# Patient Record
Sex: Female | Born: 1980
Health system: Southern US, Community
[De-identification: ages and names within clinical notes are randomized; demographics above are authoritative.]

## PROBLEM LIST (undated history)

## (undated) DIAGNOSIS — N200 Calculus of kidney: Secondary | ICD-10-CM

## (undated) DIAGNOSIS — I10 Essential (primary) hypertension: Secondary | ICD-10-CM

## (undated) DIAGNOSIS — R51 Headache: Secondary | ICD-10-CM

## (undated) DIAGNOSIS — D219 Benign neoplasm of connective and other soft tissue, unspecified: Secondary | ICD-10-CM

## (undated) DIAGNOSIS — R519 Headache, unspecified: Secondary | ICD-10-CM

## (undated) HISTORY — DX: Headache: R51

## (undated) HISTORY — DX: Calculus of kidney: N20.0

## (undated) HISTORY — DX: Essential (primary) hypertension: I10

## (undated) HISTORY — DX: Benign neoplasm of connective and other soft tissue, unspecified: D21.9

## (undated) HISTORY — DX: Headache, unspecified: R51.9

---

## 2000-10-01 ENCOUNTER — Other Ambulatory Visit: Admission: RE | Admit: 2000-10-01 | Discharge: 2000-10-01 | Payer: Self-pay | Admitting: *Deleted

## 2003-11-09 ENCOUNTER — Other Ambulatory Visit: Admission: RE | Admit: 2003-11-09 | Discharge: 2003-11-09 | Payer: Self-pay | Admitting: Family Medicine

## 2004-12-21 ENCOUNTER — Other Ambulatory Visit: Admission: RE | Admit: 2004-12-21 | Discharge: 2004-12-21 | Payer: Self-pay | Admitting: Gynecology

## 2005-11-20 ENCOUNTER — Other Ambulatory Visit: Admission: RE | Admit: 2005-11-20 | Discharge: 2005-11-20 | Payer: Self-pay | Admitting: Gynecology

## 2007-01-19 ENCOUNTER — Other Ambulatory Visit: Payer: Self-pay | Admitting: Obstetrics and Gynecology

## 2007-08-06 ENCOUNTER — Inpatient Hospital Stay (HOSPITAL_COMMUNITY): Admission: AD | Admit: 2007-08-06 | Discharge: 2007-08-11 | Payer: Self-pay | Admitting: Obstetrics and Gynecology

## 2007-12-18 ENCOUNTER — Inpatient Hospital Stay (HOSPITAL_COMMUNITY): Admission: AD | Admit: 2007-12-18 | Discharge: 2007-12-20 | Payer: Self-pay | Admitting: Obstetrics and Gynecology

## 2007-12-18 ENCOUNTER — Inpatient Hospital Stay (HOSPITAL_COMMUNITY): Admission: AD | Admit: 2007-12-18 | Discharge: 2007-12-18 | Payer: Self-pay | Admitting: Obstetrics and Gynecology

## 2009-10-22 ENCOUNTER — Inpatient Hospital Stay (HOSPITAL_COMMUNITY): Admission: AD | Admit: 2009-10-22 | Discharge: 2009-10-24 | Payer: Self-pay | Admitting: Obstetrics and Gynecology

## 2010-09-18 LAB — RH IMMUNE GLOB WKUP(>/=20WKS)(NOT WOMEN'S HOSP)

## 2010-09-18 LAB — CBC
Hemoglobin: 12.8 g/dL (ref 12.0–15.0)
MCHC: 34.5 g/dL (ref 30.0–36.0)
MCV: 97.4 fL (ref 78.0–100.0)
RBC: 3.55 MIL/uL — ABNORMAL LOW (ref 3.87–5.11)
RBC: 3.83 MIL/uL — ABNORMAL LOW (ref 3.87–5.11)
WBC: 14.1 10*3/uL — ABNORMAL HIGH (ref 4.0–10.5)

## 2010-11-13 NOTE — Op Note (Signed)
Marie Barnett, DETJEN               ACCOUNT NO.:  [id number]   MEDICAL RECORD NO.:  [id number]          PATIENT TYPE:  INP   LOCATION:                                FACILITY:  [facility]   PHYSICIAN:  Bertram Millard. Dahlstedt, M.D.DATE OF BIRTH:  1980/12/20   DATE OF PROCEDURE:  08/10/2007  DATE OF DISCHARGE:  08/11/2007                               OPERATIVE REPORT   PREOPERATIVE DIAGNOSIS:  Left proximal ureteral stone, obstructing, 20  week intrauterine pregnancy.   POSTOPERATIVE DIAGNOSIS:  Left proximal ureteral stone, obstructing, 20  week intrauterine pregnancy.   PROCEDURE PERFORMED:  Cystoscopy, left retrograde ureteral pyelogram,  left ureteroscopy (flexible) with Holmium laser of left proximal  ureteral stone, double-J stent placement.   SURGEON:  Bertram Millard. Dahlstedt, M.D.   ANESTHESIA:  General endotracheal.   COMPLICATIONS:  None.   BRIEF HISTORY:  30 year old G3, P0, at 20 weeks intrauterine pregnancy.  She presented approximately four days ago with the acute onset of left  flank pain.  She was admitted to [facility] for pain management.  The first ultrasound revealed minimal hydronephrosis on the left and she  was left on a PCA pump over the weekend.  She had a repeat ultrasound  today with significant left hydronephrosis with a hyperechoic area seen  in the proximal left ureter consistent with a stone.  As she has had  persistent pain, has needed around the clock pain medicine, and she has  a fairly large proximal ureteral stone, it was recommended that she  undergo intervention.  Options included stenting the patient, performing  ureteroscopy and lasering the stone/extraction versus watchful waiting.  I have recommended the laser ablation with possible stone extraction  versus letting the stones pass on their own.  The possibility of stent  placement was also discussed.  The risks and operative complications  were discussed, specifically, preterm labor,  infection, bleeding, loss  of her fetus.  She understands these and desires to proceed.  She was  seen by a neonatal specialist preoperatively for clearance.  She had  fetal heart tones monitor preoperatively.   DESCRIPTION OF PROCEDURE:  The patient was identified in the holding  area.  The surgical side was marked, she was administered preoperative  IV antibiotics.  She was taken to the operating room where general  anesthetic was administered using the endotracheal tube.  She was placed  in the dorsal lithotomy position.  The genitalia and perineum were  prepped and draped.  A 22 French panendoscope was advanced in her  bladder.  It appeared to be normal.   The left ureter was cannulated with an open ended catheter.  Her entire  ureter was normal except for a filling defect in the upper ureter right  at the UPJ.  There was mild hydronephrosis proximal to this.  I then  placed a guidewire by the stone, removed the ureteral catheter and the  cystoscope.  I then dilated the ureter with the inner core of an access  sheath, then placed the ureteral access sheath (35 cm) up to the mid  ureter.  Through  this, I then passed a flexible ureteroscope.  The  ureteroscope was passed under direct vision all the way to the UPJ on  the left.  The stone was encountered.  It was light tan in color.  The  200 micron laser fiber was then passed through this, and at a power of 8  joules, the stone was fragmented into multiple small pieces.  Quite a  few these rinsed distally while I was fragmenting the stone.  There were  probably 5-10 larger fragments that I thought would pass easily without  extraction.  As most of the small fragments rinsed distally, some of the  larger fragments passed more proximally into the renal pelvis.  I did  not feel that these need to be fragmented more, that they would pass  with the aid of a stent.  At this point, the ureteroscope and the access  sheath were removed.  I  then placed, over the same guidewire using  cystoscopic guidance, a 24 cm x 6 French contour stent.  Following  placement of the stent, good curls were seen proximally and distally  using cystoscopic and fluoroscopic guidance.  The scope was withdrawn  after the bladder was drained.   The patient tolerated the procedure well.  She was transported to the  PACU in stable condition.  Fetal heart tones will be checked while in  the PACU and she will be transported back to [facility] via  [facility] following reaction.      Bertram Millard. Dahlstedt, M.D.  Electronically Signed     SMD/MEDQ  D:  08/10/2007  T:  08/11/2007  Job:  [id number]   cc:   Marcelino Duster L. Vincente Poli, M.D.  Fax: [phone]

## 2010-11-13 NOTE — Discharge Summary (Signed)
NAMEADIVA, BOETTNER               ACCOUNT NO.:  [id number]   MEDICAL RECORD NO.:  [id number]          PATIENT TYPE:  INP   LOCATION:  9310                          FACILITY:  [facility]   PHYSICIAN:  Guy Sandifer. Henderson Cloud, M.D. DATE OF BIRTH:  [birth year]   DATE OF ADMISSION:  08/06/2007  DATE OF DISCHARGE:  08/11/2007                               DISCHARGE SUMMARY   ADMITTING DIAGNOSES:  1. Intrauterine pregnancy at 21 weeks estimated gestational age.  2. Left flank pain.   DISCHARGE DIAGNOSES:  1. Intrauterine pregnancy at 19-1/2 weeks estimated additional age.  2. Left kidney stone.   PROCEDURE:  On August 10, 2007 laser ablation of left kidney stone per  Dr. Marcine Matar.   REASON FOR ADMISSION:  This patient is a 30 year old married white  female G3, P0 at 70 weeks who presented with an acute onset of left  flank pain on August 06, 2007.  She presented to the hospital via EMS.  At that time evaluation revealed stable vital signs with a white count  of 13.4, hemoglobin of 12.9, platelets of 282,000.  Urine chemistry had  large hemoglobin.  Ultrasound was negative for kidney stones, although  the ureteral jet was diminished on the left side.  She was admitted for  expectant management with fluids and pain medication.  The pain  persisted through the weekend, although at times it was under some  better control.  Repeat ultrasound on August 10, 2007 revealed an  approximately 1 cm kidney stone in the left renal pelvis that was  partially obstructing the ureter.  Consultation with Dr. Retta Diones was  then undertaken who recommended surgery with ureteroscopy and laser  ablation of the kidney stone.  Consultation with Dr. Margot Ables of maternal  fetal medicine was undertaken prior to transfer.  The patient was  transferred to [facility] where she underwent the above  procedure without complication.  She was sent back to [facility].  She has a stent in the left ureter.   Today she is improved and feeling  better, although she has some discomfort and some hematuria.  Dr.  Retta Diones recommends discharge from his standpoint.  He will follow up  in a week to remove the stent.  On examination today the patient is  afebrile with stable vital signs.  The uterus was soft and nontender.   CONDITION ON DISCHARGE:  Good.   DIET:  Regular as tolerated.   ACTIVITY:  Rest, no lifting, no operation of automobiles, no vaginal  entry.  She is to call the office for problems including but not limited  to pain, persistent nausea or vomiting or temperature of 101 degrees.   MEDICATIONS:  Vicodin p.r.n. per Dr. Retta Diones, Keflex as directed from  Dr. Retta Diones.   PLAN:  Follow up in our office in 1-2 weeks.  Follow up with Dr.  Retta Diones in 1 week.      Guy Sandifer Henderson Cloud, M.D.  Electronically Signed     JET/MEDQ  D:  08/11/2007  T:  08/12/2007  Job:  [id number]   cc:   Bertram Millard. Dahlstedt,  M.D.  Fax: [phone]   Felipa Eth, MD

## 2010-11-13 NOTE — Consult Note (Signed)
Marie Barnett, LITES               ACCOUNT NO.:  [id number]   MEDICAL RECORD NO.:  [id number]          PATIENT TYPE:  INP   LOCATION:  9310                          [facility]ACILITY:  [facility]   PHYSICIAN:  Bertram Millard. Dahlstedt, M.D.DATE OF BIRTH:  1981-04-14   DATE OF CONSULTATION:  08/10/2007  DATE OF DISCHARGE:                                 CONSULTATION   REASON FOR CONSULTATION:  Right upper ureteral stone.   BRIEF HISTORY:  This 30 year old G3, P0, female at 20 weeks pregnancy,  presented 3 days ago with acute onset of flank pain.  This was on the  left side.  Upon initial presentation, she came to the maternity  admissions by EMS.  She has had two ultrasounds.  The first showed  slight prominence of her left collecting system.  The repeat renal  ultrasound today revealed significant left hydronephrosis and a  hyperechoic area in the proximal left ureter.  She has now carries the  diagnosis of an obstructing left UPJ stone.  Urologic consultation is  requested.   There is no prior history of kidney stones with this patient.  She has  had no issues during this pregnancy.  There is a family history of  kidney stones in her father.  She denies any gross hematuria.  She  denies any fever or chills.  There was nausea upon her initial pain.  She has been in the hospital here, with this being the fourth day of her  visit.  She has been on a Dilaudid PCA for intermittent pain.   MEDICAL HISTORY:  Basically insignificant.   MEDICATIONS:  Only a prenatal vitamin.   ALLERGIES:  AMOXICILLIN.   She is married, is a Astronomer.  She does  not use tobacco.   EXAM:  A pleasant female.  She appeared comfortable.  Abdomen was  gravid, nontender.   I reviewed the patient's renal ultrasound, and this showed a proximal  ureteral stone.   IMPRESSION:  Proximal left ureteral stone with hydronephrosis.   PLAN:  1. I spoke with the patient and her husband at length.   Seeing that      this is about a 10-mm stone located proximally, and a      hospitalization of 4 days, I do not think she is going to pass the      stone.  2. I have recommended that we proceed with anesthetic, cystoscopy,      retrograde pyelogram, ureteroscopy and      laser ablation of the stone with a temporary stent placement.      Risks and complications were discussed, including but not limited      to infection, bleeding, injury to the urinary tract and preterm      labor due to the anesthetic.  They understand these and desire to      proceed.      Bertram Millard. Dahlstedt, M.D.  Electronically Signed     SMD/MEDQ  D:  08/10/2007  T:  08/11/2007  Job:  [id number]

## 2011-03-22 LAB — URINALYSIS, ROUTINE W REFLEX MICROSCOPIC
Bilirubin Urine: NEGATIVE
Glucose, UA: NEGATIVE
Ketones, ur: NEGATIVE
Ketones, ur: NEGATIVE
Ketones, ur: NEGATIVE
Leukocytes, UA: NEGATIVE
Nitrite: NEGATIVE
Nitrite: NEGATIVE
Nitrite: NEGATIVE
Protein, ur: NEGATIVE
Protein, ur: NEGATIVE
Specific Gravity, Urine: 1.01
Urobilinogen, UA: 0.2
Urobilinogen, UA: 0.2
pH: 7.5
pH: 8.5 — ABNORMAL HIGH

## 2011-03-22 LAB — CBC
HCT: 36.4
MCV: 94.5
RBC: 3.86 — ABNORMAL LOW
WBC: 13.4 — ABNORMAL HIGH

## 2011-03-22 LAB — COMPREHENSIVE METABOLIC PANEL
AST: 19
Alkaline Phosphatase: 62
BUN: 6
CO2: 23
Chloride: 102
Creatinine, Ser: 0.53
GFR calc Af Amer: >60
GFR calc non Af Amer: >60
Total Bilirubin: 0.6

## 2011-03-22 LAB — URINE CULTURE
Colony Count: NO GROWTH
Culture: NO GROWTH

## 2011-03-22 LAB — URINE MICROSCOPIC-ADD ON

## 2011-03-22 LAB — DIFFERENTIAL
Basophils Absolute: 0
Basophils Relative: 0
Eosinophils Relative: 0
Lymphocytes Relative: 9 — ABNORMAL LOW

## 2011-03-28 LAB — CBC
HCT: 33.9 — ABNORMAL LOW
HCT: 39.8
Hemoglobin: 13.6
MCHC: 33.6
MCHC: 34.1
MCV: 99.3
RBC: 3.42 — ABNORMAL LOW
RBC: 4.11
RDW: 13.7
WBC: 18 — ABNORMAL HIGH

## 2011-04-15 LAB — ABO/RH: ABO/RH(D): O NEG

## 2015-03-16 ENCOUNTER — Encounter: Payer: Self-pay | Admitting: Primary Care

## 2015-03-16 ENCOUNTER — Ambulatory Visit (INDEPENDENT_AMBULATORY_CARE_PROVIDER_SITE_OTHER): Payer: BLUE CROSS/BLUE SHIELD | Admitting: Primary Care

## 2015-03-16 ENCOUNTER — Encounter (INDEPENDENT_AMBULATORY_CARE_PROVIDER_SITE_OTHER): Payer: Self-pay

## 2015-03-16 VITALS — BP 122/82 | HR 73 | Temp 97.5°F | Ht 64.25 in | Wt 166.8 lb

## 2015-03-16 DIAGNOSIS — I10 Essential (primary) hypertension: Secondary | ICD-10-CM | POA: Diagnosis not present

## 2015-03-16 DIAGNOSIS — L409 Psoriasis, unspecified: Secondary | ICD-10-CM | POA: Diagnosis not present

## 2015-03-16 MED ORDER — FLUOCINONIDE-E 0.05 % EX CREA: 1.0000 "application " | TOPICAL_CREAM | Freq: Two times a day (BID) | CUTANEOUS | Status: DC

## 2015-03-16 NOTE — Assessment & Plan Note (Signed)
Diagnosed in 2013, managed on Amlodipine 5 mg. BP stable today. Continue current regimen.

## 2015-03-16 NOTE — Progress Notes (Signed)
Subjective:    Patient ID: Marie Barnett, female    DOB: 28-May-1981, 34 y.o.   MRN: [id number]  HPI  Marie Barnett is a 34 year old female who presents today to establish care and discuss the problems mentioned below. Will obtain old records. She recently had TSH, Vitamin D, CBC, Ferritin from GYN office due to fatigue, hairloss. All labs were normal per patient. Last physical 2 years ago.  1) Essential Hypertension: Diagnosed in 2013. Managed on Amlodipine 5 mg. BP is stable in clinic today. Denies chest pain. Some headaches with menstrual cycles.   2) Dry Skin: Dry patch present to right patella for the past several years. She's been using her daughter's Triamcinolone 0.5% cream for the past month with some reduction. Denies itching, pain.   Review of Systems  Constitutional: Negative for unexpected weight change.  HENT: Negative for rhinorrhea.   Respiratory: Negative for cough and shortness of breath.   Cardiovascular: Negative for chest pain.  Gastrointestinal: Negative for diarrhea and constipation.  Genitourinary: Negative for difficulty urinating.  Musculoskeletal: Negative for myalgias and arthralgias.  Skin: Positive for rash.  Allergic/Immunologic: Negative for environmental allergies.  Neurological:       Some headaches with cycle  Psychiatric/Behavioral:       Some anxiety, denies concerns for depression       Past Medical History  Diagnosis Date  . Frequent headaches   . Hypertension   . Kidney stone   . Fibroids     Uterine    Social History   Social History  . Marital Status: Married    Spouse Name: N/A  . Number of Children: N/A  . Years of Education: N/A   Occupational History  . Not on file.   Social History Main Topics  . Smoking status: Never Smoker   . Smokeless tobacco: Not on file  . Alcohol Use: 0.0 oz/week    0 Standard drinks or equivalent per week     Comment: social  . Drug Use: Not on file  . Sexual Activity: Not on file   Other  Topics Concern  . Not on file   Social History Narrative   Married.   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   Enjoys spending time at [employer] and pool.        Past Surgical History  Procedure Laterality Date  . Cesarean section  2014    Family History  Problem Relation Age of Onset  . Hyperlipidemia Mother   . Hypertension Mother   . Cystic kidney disease Mother   . Hyperlipidemia Father   . Hypertension Father   . Alcohol abuse Brother   . Alcohol abuse Maternal Uncle   . Hyperlipidemia Maternal Grandmother   . Hypertension Maternal Grandmother   . Stroke Maternal Grandmother   . Depression Maternal Grandmother   . Prostate cancer Maternal Grandfather   . Stroke Maternal Grandfather   . Colon cancer Paternal Grandmother   . Bipolar disorder Neg Hx     Allergies  Allergen Reactions  . Amoxicillin     No current outpatient prescriptions on file prior to visit.   No current facility-administered medications on file prior to visit.    BP 122/82 mmHg  Pulse 73  Temp(Src) 97.5 F (36.4 C) (Oral)  Ht 5' 4.25" (1.632 m)  Wt 166 lb 12.8 oz (75.66 kg)  BMI 28.41 kg/m2  SpO2 98%  LMP 03/05/2015    Objective:   Physical  Exam  Constitutional: She is oriented to person, place, and time. She appears well-nourished.  Cardiovascular: Normal rate and regular rhythm.   Pulmonary/Chest: Effort normal and breath sounds normal.  Neurological: She is alert and oriented to person, place, and time.  Skin: Skin is warm and dry.  Small patch of dry, scaley skinm representative of psoriasis, noted to right patella.  Psychiatric: She has a normal mood and affect.          Assessment & Plan:

## 2015-03-16 NOTE — Patient Instructions (Signed)
Apply cream twice daily to affected area.  Please schedule a physical with me in the next 3 months. You will also schedule a lab only appointment one week prior. We will discuss your lab results during your physical.  It was a pleasure to meet you today! Please don't hesitate to call me with any questions. Welcome to [facility]!

## 2015-03-16 NOTE — Progress Notes (Signed)
Pre visit review using our clinic review tool, if applicable. No additional management support is needed unless otherwise documented below in the visit note. 

## 2015-03-16 NOTE — Assessment & Plan Note (Signed)
Present to bilateral patella. More so present today on right. RX for lidex cream for treatment.

## 2015-05-03 ENCOUNTER — Ambulatory Visit (INDEPENDENT_AMBULATORY_CARE_PROVIDER_SITE_OTHER): Payer: BLUE CROSS/BLUE SHIELD

## 2015-05-03 DIAGNOSIS — Z23 Encounter for immunization: Secondary | ICD-10-CM

## 2015-06-04 ENCOUNTER — Other Ambulatory Visit: Payer: Self-pay | Admitting: Primary Care

## 2015-06-04 DIAGNOSIS — I10 Essential (primary) hypertension: Secondary | ICD-10-CM

## 2015-06-08 ENCOUNTER — Other Ambulatory Visit: Payer: BLUE CROSS/BLUE SHIELD

## 2015-06-14 ENCOUNTER — Encounter: Payer: BLUE CROSS/BLUE SHIELD | Admitting: Primary Care

## 2015-06-15 ENCOUNTER — Encounter: Payer: BLUE CROSS/BLUE SHIELD | Admitting: Primary Care

## 2015-06-22 ENCOUNTER — Other Ambulatory Visit: Payer: BLUE CROSS/BLUE SHIELD

## 2015-06-22 ENCOUNTER — Other Ambulatory Visit (INDEPENDENT_AMBULATORY_CARE_PROVIDER_SITE_OTHER): Payer: BLUE CROSS/BLUE SHIELD

## 2015-06-22 DIAGNOSIS — I10 Essential (primary) hypertension: Secondary | ICD-10-CM

## 2015-06-22 LAB — COMPREHENSIVE METABOLIC PANEL
ALT: 14 U/L (ref 0–35)
AST: 16 U/L (ref 0–37)
Albumin: 4.2 g/dL (ref 3.5–5.2)
Alkaline Phosphatase: 81 U/L (ref 39–117)
BUN: 15 mg/dL (ref 6–23)
CHLORIDE: 102 meq/L (ref 96–112)
CO2: 33 mEq/L — ABNORMAL HIGH (ref 19–32)
Calcium: 9.7 mg/dL (ref 8.4–10.5)
Creatinine, Ser: 0.82 mg/dL (ref 0.40–1.20)
GFR: 83.58 mL/min (ref >60.00)
GLUCOSE: 94 mg/dL (ref 70–99)
POTASSIUM: 3.8 meq/L (ref 3.5–5.1)
SODIUM: 139 meq/L (ref 135–145)
TOTAL PROTEIN: 7.7 g/dL (ref 6.0–8.3)
Total Bilirubin: 0.7 mg/dL (ref 0.2–1.2)

## 2015-06-22 LAB — LIPID PANEL
Cholesterol: 205 mg/dL — ABNORMAL HIGH (ref 0–200)
HDL: 43.8 mg/dL (ref >39.00)
LDL Cholesterol: 136 mg/dL — ABNORMAL HIGH (ref 0–99)
NONHDL: 160.91
Total CHOL/HDL Ratio: 5
Triglycerides: 124 mg/dL (ref 0.0–149.0)
VLDL: 24.8 mg/dL (ref 0.0–40.0)

## 2015-06-28 ENCOUNTER — Ambulatory Visit (INDEPENDENT_AMBULATORY_CARE_PROVIDER_SITE_OTHER): Payer: BLUE CROSS/BLUE SHIELD | Admitting: Primary Care

## 2015-06-28 ENCOUNTER — Encounter: Payer: Self-pay | Admitting: Primary Care

## 2015-06-28 VITALS — BP 150/96 | HR 68 | Temp 97.9°F | Ht 64.25 in | Wt 168.0 lb

## 2015-06-28 DIAGNOSIS — E785 Hyperlipidemia, unspecified: Secondary | ICD-10-CM | POA: Diagnosis not present

## 2015-06-28 DIAGNOSIS — Z0000 Encounter for general adult medical examination without abnormal findings: Secondary | ICD-10-CM | POA: Diagnosis not present

## 2015-06-28 DIAGNOSIS — Z0001 Encounter for general adult medical examination with abnormal findings: Secondary | ICD-10-CM | POA: Insufficient documentation

## 2015-06-28 DIAGNOSIS — I10 Essential (primary) hypertension: Secondary | ICD-10-CM | POA: Diagnosis not present

## 2015-06-28 NOTE — Patient Instructions (Addendum)
Check your blood pressure daily, around the same time of day, for the next 2 weeks.   Ensure that you have rested for 30 minutes prior to checking your blood pressure. Record your readings and send them to me on My Chart.  It is important that you improve your diet. Please limit carbohydrates in the form of white bread, rice, pasta, desserts, fast foods, etc. Increase your consumption of fresh fruits and vegetables.  You need to consume about 2 liters of water daily.  Start exercising. You should be getting 1 hour of moderate intensity exercise 5 days weekly.  Follow up in 1 year for repeat physical or sooner if needed.  It was a pleasure to see you today!  High Cholesterol High cholesterol refers to having a high level of cholesterol in your blood. Cholesterol is a white, waxy, fat-like protein that your body needs in small amounts. Your liver makes all the cholesterol you need. Excess cholesterol comes from the food you eat. Cholesterol travels in your bloodstream through your blood vessels. If you have high cholesterol, deposits (plaque) may build up on the walls of your blood vessels. This makes the arteries narrower and stiffer. Plaque increases your risk of heart attack and stroke. Work with your health care provider to keep your cholesterol levels in a healthy range. RISK FACTORS Several things can make you more likely to have high cholesterol. These include:   Eating foods high in animal fat (saturated fat) or cholesterol.  Being overweight.  Not getting enough exercise.  Having a family history of high cholesterol. SIGNS AND SYMPTOMS High cholesterol does not cause symptoms. DIAGNOSIS  Your health care provider can do a blood test to check whether you have high cholesterol. If you are older than 20, your health care provider may check your cholesterol every 4-6 years. You may be checked more often if you already have high cholesterol or other risk factors for heart disease. The  blood test for cholesterol measures the following:  Bad cholesterol (LDL cholesterol). This is the type of cholesterol that causes heart disease. This number should be less than 100.  Good cholesterol (HDL cholesterol). This type helps protect against heart disease. A healthy level of HDL cholesterol is 60 or higher.  Total cholesterol. This is the combined number of LDL cholesterol and HDL cholesterol. A healthy number is less than 200. TREATMENT  High cholesterol can be treated with diet changes, lifestyle changes, and medicine.   Diet changes may include eating more whole grains, fruits, vegetables, nuts, and fish. You may also have to cut back on red meat and foods with a lot of added sugar.  Lifestyle changes may include getting at least 40 minutes of aerobic exercise three times a week. Aerobic exercises include walking, biking, and swimming. Aerobic exercise along with a healthy diet can help you maintain a healthy weight. Lifestyle changes may also include quitting smoking.  If diet and lifestyle changes are not enough to lower your cholesterol, your health care provider may prescribe a statin medicine. This medicine has been shown to lower cholesterol and also lower the risk of heart disease. HOME CARE INSTRUCTIONS  Only take over-the-counter or prescription medicines as directed by your health care provider.   Follow a healthy diet as directed by your health care provider. For instance:   Eat chicken (without skin), fish, veal, shellfish, ground [place] breast, and round or loin cuts of red meat.  Do not eat fried foods and fatty meats, such as  hot dogs and salami.   Eat plenty of fruits, such as apples.   Eat plenty of vegetables, such as broccoli, potatoes, and carrots.   Eat beans, peas, and lentils.   Eat grains, such as barley, rice, couscous, and bulgur wheat.   Eat pasta without cream sauces.   Use skim or nonfat milk and low-fat or nonfat yogurt and  cheeses. Do not eat or drink whole milk, cream, ice cream, egg yolks, and hard cheeses.   Do not eat stick margarine or tub margarines that contain trans fats (also called partially hydrogenated oils).   Do not eat cakes, cookies, crackers, or other baked goods that contain trans fats.   Do not eat saturated tropical oils, such as coconut and palm oil.   Exercise as directed by your health care provider. Increase your activity level with activities such as gardening or walking.   Keep all follow-up appointments.  SEEK MEDICAL CARE IF:  You are struggling to maintain a healthy diet or weight.  You need help starting an exercise program.  You need help to stop smoking. SEEK IMMEDIATE MEDICAL CARE IF:  You have chest pain.  You have trouble breathing.   This information is not intended to replace advice given to you by your health care provider. Make sure you discuss any questions you have with your health care provider.   Document Released: 06/17/2005 Document Revised: 07/08/2014 Document Reviewed: 04/09/2013 Elsevier Interactive Patient Education [employer].

## 2015-06-28 NOTE — Assessment & Plan Note (Signed)
Above goal today, patient endorses "white coat" syndrome and will check home BP for 2 weeks and send them to me via My Chart.

## 2015-06-28 NOTE — Assessment & Plan Note (Signed)
Strong FH. Slightly above goal, will have her work on improving diet and start exercising. Will continue to monitor.

## 2015-06-28 NOTE — Progress Notes (Signed)
Pre visit review using our clinic review tool, if applicable. No additional management support is needed unless otherwise documented below in the visit note. 

## 2015-06-28 NOTE — Assessment & Plan Note (Signed)
Tdap and Flu UTD. Pap UTD. Discussed the importance of a healthy diet and regular exercise in order for weight loss and to reduce risk of other medical diseases. Labs with slight elevation in cholesterol, will have her focus on diet and exercise. Exam unremarkable. Follow up in 1 year for repeat physical.

## 2015-06-28 NOTE — Progress Notes (Signed)
Subjective:    Patient ID: Marie Barnett, female    DOB: May 30, 1981, 34 y.o.   MRN: [id number]  HPI  Marie Barnett is a 34 year old female who presents today for complete physical.  Immunizations: -Tetanus: Completed in 2014. -Influenza: Completed in November 2016.   Diet: Endorses a fair diet. Breakfast: Nutrigrain bar, juice, fruit Lunch: Sandwich, chips, oranges Dinner: Home cooked meals (meat, vegetables, starches), restaurants  Snacks: Cream cheese and crackers Desserts: 3-4 times weekly Beverages: Water, juice, diet soda, sweet tea occasionally, limited alcohol  Exercise: She is not currently exercising. She is active at home. Eye exam: Completed in August 2016. New RX for glasses. Dental exam: Completes annually. Pap Smear: Completed in February 2016, normal.   Review of Systems  Constitutional: Negative for unexpected weight change.  HENT: Negative for rhinorrhea.   Respiratory: Negative for cough and shortness of breath.   Cardiovascular: Negative for chest pain.  Gastrointestinal: Negative for diarrhea and constipation.  Genitourinary:       Regular periods, follows with GYN.  Musculoskeletal: Negative for myalgias and arthralgias.  Neurological: Negative for dizziness, numbness and headaches.  Psychiatric/Behavioral:       Denies concerns for anxiety or depression. Does have difficulty falling asleep most nights. It will take her 1-2 hours to fall asleep.       Past Medical History  Diagnosis Date  . Frequent headaches   . Hypertension   . Kidney stone   . Fibroids     Uterine    Social History   Social History  . Marital Status: Married    Spouse Name: N/A  . Number of Children: N/A  . Years of Education: N/A   Occupational History  . Not on file.   Social History Main Topics  . Smoking status: Never Smoker   . Smokeless tobacco: Not on file  . Alcohol Use: 0.0 oz/week    0 Standard drinks or equivalent per week     Comment: social  .  Drug Use: Not on file  . Sexual Activity: Not on file   Other Topics Concern  . Not on file   Social History Narrative   Married.   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   Enjoys spending time at [employer] and pool.        Past Surgical History  Procedure Laterality Date  . Cesarean section  2014    Family History  Problem Relation Age of Onset  . Hyperlipidemia Mother   . Hypertension Mother   . Cystic kidney disease Mother   . Hyperlipidemia Father   . Hypertension Father   . Alcohol abuse Brother   . Alcohol abuse Maternal Uncle   . Hyperlipidemia Maternal Grandmother   . Hypertension Maternal Grandmother   . Stroke Maternal Grandmother   . Depression Maternal Grandmother   . Prostate cancer Maternal Grandfather   . Stroke Maternal Grandfather   . Colon cancer Paternal Grandmother   . Bipolar disorder Neg Hx     Allergies  Allergen Reactions  . Amoxicillin     Current Outpatient Prescriptions on File Prior to Visit  Medication Sig Dispense Refill  . amLODipine (NORVASC) 5 MG tablet Take 5 mg by mouth daily.    . fluocinonide-emollient (LIDEX-E) 0.05 % cream Apply 1 application topically 2 (two) times daily. 30 g 0  . levonorgestrel (MIRENA) 20 MCG/24HR IUD 1 each by Intrauterine route once.    . Multiple Vitamin (MULTIVITAMIN) capsule  Take 1 capsule by mouth daily.    Marland Kitchen VITAMIN D, ERGOCALCIFEROL, PO Take by mouth. Taking 2 gummy daily.     No current facility-administered medications on file prior to visit.    BP 150/96 mmHg  Pulse 68  Temp(Src) 97.9 F (36.6 C) (Oral)  Ht 5' 4.25" (1.632 m)  Wt 168 lb (76.204 kg)  BMI 28.61 kg/m2  LMP 06/09/2015    Objective:   Physical Exam  Constitutional: She is oriented to person, place, and time. She appears well-nourished.  HENT:  Right Ear: Tympanic membrane and ear canal normal.  Left Ear: Tympanic membrane and ear canal normal.  Nose: Nose normal.  Mouth/Throat: Oropharynx is clear and moist.    Eyes: Conjunctivae and EOM are normal. Pupils are equal, round, and reactive to light.  Neck: Neck supple. No thyromegaly present.  Cardiovascular: Normal rate and regular rhythm.   Pulmonary/Chest: Effort normal and breath sounds normal.  Abdominal: Soft. Bowel sounds are normal. There is no tenderness.  Musculoskeletal: Normal range of motion.  Neurological: She is alert and oriented to person, place, and time. She has normal reflexes. No cranial nerve deficit.  Skin: Skin is warm and dry.  Psychiatric: She has a normal mood and affect.          Assessment & Plan:

## 2015-07-22 ENCOUNTER — Encounter: Payer: Self-pay | Admitting: Primary Care

## 2015-08-07 ENCOUNTER — Encounter: Payer: Self-pay | Admitting: Primary Care

## 2015-08-11 ENCOUNTER — Ambulatory Visit
Admission: RE | Admit: 2015-08-11 | Discharge: 2015-08-11 | Disposition: A | Discharge status: Home / Self Care | Payer: BLUE CROSS/BLUE SHIELD | Source: Ambulatory Visit | Attending: Family Medicine | Admitting: Family Medicine

## 2015-08-11 ENCOUNTER — Ambulatory Visit (INDEPENDENT_AMBULATORY_CARE_PROVIDER_SITE_OTHER): Payer: BLUE CROSS/BLUE SHIELD | Admitting: Family Medicine

## 2015-08-11 ENCOUNTER — Encounter: Payer: Self-pay | Admitting: Primary Care

## 2015-08-11 ENCOUNTER — Encounter: Payer: Self-pay | Admitting: Family Medicine

## 2015-08-11 VITALS — BP 138/88 | HR 96 | Temp 98.3°F | Ht 64.25 in | Wt 166.8 lb

## 2015-08-11 DIAGNOSIS — I878 Other specified disorders of veins: Secondary | ICD-10-CM | POA: Insufficient documentation

## 2015-08-11 DIAGNOSIS — G8929 Other chronic pain: Secondary | ICD-10-CM | POA: Insufficient documentation

## 2015-08-11 DIAGNOSIS — N200 Calculus of kidney: Secondary | ICD-10-CM | POA: Diagnosis not present

## 2015-08-11 DIAGNOSIS — R109 Unspecified abdominal pain: Secondary | ICD-10-CM | POA: Insufficient documentation

## 2015-08-11 DIAGNOSIS — M549 Dorsalgia, unspecified: Secondary | ICD-10-CM

## 2015-08-11 LAB — POCT URINALYSIS DIPSTICK
BILIRUBIN UA: NEGATIVE
Glucose, UA: NEGATIVE
KETONES UA: 40
Leukocytes, UA: NEGATIVE
Nitrite, UA: NEGATIVE
PH UA: 7.5
Protein, UA: 30
Spec Grav, UA: 1.015
Urobilinogen, UA: 0.2

## 2015-08-11 LAB — POCT URINE PREGNANCY: Preg Test, Ur: NEGATIVE

## 2015-08-11 MED ORDER — TAMSULOSIN HCL 0.4 MG PO CAPS: 0.4000 mg | ORAL_CAPSULE | Freq: Every day | ORAL | Status: DC

## 2015-08-11 MED ORDER — TRAMADOL HCL 50 MG PO TABS: 50.0000 mg | ORAL_TABLET | Freq: Three times a day (TID) | ORAL | Status: DC | PRN

## 2015-08-11 NOTE — Patient Instructions (Signed)
Nice to meet you. You likely have a kidney stone. We will treat this with Flomax and tramadol. Please go get the x-ray of your abdomen to evaluate for kidney stones. If you develop fever, abdominal pain, unrelenting pain, inability to take in liquids, or any new or change in symptoms please seek medical attention immediately.

## 2015-08-11 NOTE — Progress Notes (Signed)
Patient ID: Marie Barnett, female   DOB: 13-Aug-1980, 35 y.o.   MRN: [id number]  Tommi Rumps, MD Phone: [phone]  Marie Barnett is a 35 y.o. female who presents today for same-day visit.  Left flank pain: Patient notes history of kidney stone. States she feels as though she has a kidney stone at this time. She notes intermittent colicky left flank pain. Started yesterday.. Notes some nausea with this. No vomiting. No hematuria. Minimal burning just recently with urination. No frequency or urgency. No abdominal pain. No fevers. States this does not feel like a UTI. States this feels exactly like her prior kidney stone. She is currently on her period.  PMH: nonsmoker. History of kidney stones.   ROS see history of present illness.  Objective  Physical Exam Filed Vitals:   08/11/15 1551  BP: 138/88  Pulse: 96  Temp: 98.3 F (36.8 C)    BP Readings from Last 3 Encounters:  08/11/15 138/88  06/28/15 150/96  03/16/15 122/82   Wt Readings from Last 3 Encounters:  08/11/15 166 lb 12.8 oz (75.66 kg)  06/28/15 168 lb (76.204 kg)  03/16/15 166 lb 12.8 oz (75.66 kg)    Physical Exam  Constitutional: She is well-developed, well-nourished, and in no distress.  Appears mildly uncomfortable  HENT:  Head: Normocephalic and atraumatic.  Right Ear: External ear normal.  Left Ear: External ear normal.  Nose: Nose normal.  Mouth/Throat: Oropharynx is clear and moist.  Eyes: Conjunctivae are normal. Pupils are equal, round, and reactive to light.  Cardiovascular: Normal rate, regular rhythm and normal heart sounds.  Exam reveals no gallop and no friction rub.   No murmur heard. Pulmonary/Chest: Effort normal and breath sounds normal. No respiratory distress. She has no wheezes. She has no rales.  Abdominal: Soft. Bowel sounds are normal. She exhibits no distension. There is no tenderness. There is no rebound and no guarding.  Musculoskeletal:  Mild left low back tenderness, no  midline spine tenderness  Neurological: She is alert. Gait normal.  Skin: Skin is warm and dry. She is not diaphoretic.     Assessment/Plan: Please see individual problem list.  Kidney stone History and UA consistent with kidney stone. No abdominal pain with this. Does not appear to be a UTI with lack of frequency and urgency. No UA indications of UTI. Benign abdominal exam. We will obtain a KUB to evaluate for kidney stone. We'll send her urine for culture. We will treat her with Flomax to facilitate passing of the stone. We'll also treat with tramadol for pain. Advised of drowsiness with tramadol. Advised of possible lightheadedness with Flomax. Urine pregnancy test is negative. She will stay well hydrated and try to strain her urine. She is given return precautions.    Orders Placed This Encounter  Procedures  . Urine Culture  . DG Abd 1 View    Standing Status: Future     Number of Occurrences: 1     Standing Expiration Date: 10/08/2016    Order Specific Question:  Reason for Exam (SYMPTOM  OR DIAGNOSIS REQUIRED)    Answer:  flank pain, history of kidney stone    Order Specific Question:  Is the patient pregnant?    Answer:  No    Order Specific Question:  Preferred imaging location?    Answer:  [facility]  . Urine Microscopic Only  . POCT Urinalysis Dipstick  . POCT urine pregnancy    Meds ordered this encounter  Medications  . tamsulosin (FLOMAX) 0.4  MG CAPS capsule    Sig: Take 1 capsule (0.4 mg total) by mouth daily.    Dispense:  30 capsule    Refill:  0  . traMADol (ULTRAM) 50 MG tablet    Sig: Take 1 tablet (50 mg total) by mouth every 8 (eight) hours as needed.    Dispense:  30 tablet    Refill:  0    Tommi Rumps

## 2015-08-11 NOTE — Progress Notes (Signed)
Pre visit review using our clinic review tool, if applicable. No additional management support is needed unless otherwise documented below in the visit note. 

## 2015-08-11 NOTE — Assessment & Plan Note (Addendum)
History and UA consistent with kidney stone. No abdominal pain with this. Does not appear to be a UTI with lack of frequency and urgency. No UA indications of UTI. Benign abdominal exam. We will obtain a KUB to evaluate for kidney stone. We'll send her urine for culture. We will treat her with Flomax to facilitate passing of the stone. We'll also treat with tramadol for pain. Advised of drowsiness with tramadol. Advised of possible lightheadedness with Flomax. Urine pregnancy test is negative. She will stay well hydrated and try to strain her urine. She is given return precautions.

## 2015-08-12 ENCOUNTER — Encounter: Payer: Self-pay | Admitting: Primary Care

## 2015-08-12 LAB — URINALYSIS, MICROSCOPIC ONLY
Bacteria, UA: NONE SEEN [HPF]
CASTS: NONE SEEN [LPF]
CRYSTALS: NONE SEEN [HPF]
Yeast: NONE SEEN [HPF]

## 2015-08-13 LAB — URINE CULTURE
Colony Count: NO GROWTH
Organism ID, Bacteria: NO GROWTH

## 2015-08-14 ENCOUNTER — Encounter: Payer: Self-pay | Admitting: Family Medicine

## 2015-08-14 ENCOUNTER — Ambulatory Visit: Payer: BLUE CROSS/BLUE SHIELD | Admitting: Primary Care

## 2015-08-21 ENCOUNTER — Encounter: Payer: Self-pay | Admitting: Primary Care

## 2015-08-21 ENCOUNTER — Other Ambulatory Visit: Payer: Self-pay | Admitting: Primary Care

## 2015-08-21 DIAGNOSIS — I10 Essential (primary) hypertension: Secondary | ICD-10-CM

## 2015-08-21 MED ORDER — AMLODIPINE BESYLATE 5 MG PO TABS: 5.0000 mg | ORAL_TABLET | Freq: Every day | ORAL | Status: DC

## 2015-11-19 ENCOUNTER — Other Ambulatory Visit: Payer: Self-pay | Admitting: Primary Care

## 2016-01-11 ENCOUNTER — Other Ambulatory Visit: Payer: Self-pay | Admitting: Primary Care

## 2016-01-11 DIAGNOSIS — L409 Psoriasis, unspecified: Secondary | ICD-10-CM

## 2016-01-11 NOTE — Telephone Encounter (Signed)
Electronically refill request for   fluocinonide-emollient (LIDEX-E) 0.05 % cream   Apply 1 application topically 2 (two) times daily.  Dispense: 30 g   Refills: 0     Last prescribed on 03/16/2015. Last seen on 06/28/2015. No future appt.

## 2016-07-16 ENCOUNTER — Ambulatory Visit (INDEPENDENT_AMBULATORY_CARE_PROVIDER_SITE_OTHER): Payer: BLUE CROSS/BLUE SHIELD | Admitting: Primary Care

## 2016-07-16 ENCOUNTER — Encounter: Payer: Self-pay | Admitting: Primary Care

## 2016-07-16 VITALS — BP 144/90 | HR 74 | Temp 98.2°F | Ht 64.25 in | Wt 174.4 lb

## 2016-07-16 DIAGNOSIS — M5442 Lumbago with sciatica, left side: Secondary | ICD-10-CM | POA: Diagnosis not present

## 2016-07-16 MED ORDER — PREDNISONE 10 MG PO TABS: ORAL_TABLET | ORAL | 0 refills | Status: DC

## 2016-07-16 NOTE — Progress Notes (Signed)
Subjective:    Patient ID: Marie Barnett, female    DOB: Sep 10, 1980, 36 y.o.   MRN: [id number]  HPI  Marie Barnett is a 36 year old female with a history of kidney stones who presents today with a chief complaint of back pain. Her pain is located to the left lower back with radiation of pain to her left hip and lower extremity. Her pain began Saturday night. Tuesday last week she lifted her daughter from the shower very quickly and thinks this was the cause. She's been taking ibuprofen with some improvement, using heating pads with improvement, and muscle relaxers without improvement. She denies numbness/tingling, history of prior back pain, urinary symptoms. Her pain is worse with sitting, improved with standing.   Review of Systems  Genitourinary: Negative for flank pain, frequency and hematuria.  Musculoskeletal: Positive for back pain.  Neurological: Negative for numbness.       Past Medical History:  Diagnosis Date  . Fibroids    Uterine  . Frequent headaches   . Hypertension   . Kidney stone      Social History   Social History  . Marital status: Married    Spouse name: N/A  . Number of children: N/A  . Years of education: N/A   Occupational History  . Not on file.   Social History Main Topics  . Smoking status: Never Smoker  . Smokeless tobacco: Not on file  . Alcohol use 0.0 oz/week     Comment: social  . Drug use: Unknown  . Sexual activity: Not on file   Other Topics Concern  . Not on file   Social History Narrative   Married.   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   Enjoys spending time at [employer] and pool.        Past Surgical History:  Procedure Laterality Date  . CESAREAN SECTION  2014    Family History  Problem Relation Age of Onset  . Hyperlipidemia Mother   . Hypertension Mother   . Cystic kidney disease Mother   . Hyperlipidemia Father   . Hypertension Father   . Alcohol abuse Brother   . Alcohol abuse Maternal Uncle   .  Hyperlipidemia Maternal Grandmother   . Hypertension Maternal Grandmother   . Stroke Maternal Grandmother   . Depression Maternal Grandmother   . Prostate cancer Maternal Grandfather   . Stroke Maternal Grandfather   . Colon cancer Paternal Grandmother   . Bipolar disorder Neg Hx     Allergies  Allergen Reactions  . Amoxicillin     Current Outpatient Prescriptions on File Prior to Visit  Medication Sig Dispense Refill  . amLODipine (NORVASC) 5 MG tablet TAKE 1 TABLET (5 MG TOTAL) BY MOUTH DAILY. 90 tablet 3  . levonorgestrel (MIRENA) 20 MCG/24HR IUD 1 each by Intrauterine route once.    . Multiple Vitamin (MULTIVITAMIN) capsule Take 1 capsule by mouth daily.    Marland Kitchen VITAMIN D, ERGOCALCIFEROL, PO Take by mouth. Taking 2 gummy daily.     No current facility-administered medications on file prior to visit.     BP (!) 144/90   Pulse 74   Temp 98.2 F (36.8 C) (Oral)   Ht 5' 4.25" (1.632 m)   Wt 174 lb 6.4 oz (79.1 kg)   LMP 07/01/2016   SpO2 99%   BMI 29.70 kg/m    Objective:   Physical Exam  Constitutional: She appears well-nourished.  Cardiovascular: Normal rate and  regular rhythm.   Pulmonary/Chest: Effort normal and breath sounds normal.  Musculoskeletal:       Lumbar back: She exhibits decreased range of motion and pain. She exhibits no tenderness and no bony tenderness.  Negative straight leg raise bilaterally  Skin: Skin is warm and dry.          Assessment & Plan:  Acute Low Back Pain:  Located to left lower back. Suspect this was caused by lifting her daughter abruptly. Exam today suspicious for sciatic nerve involvement likely secondary to muscle spasm. No bony abnormality. Will treat with low dose prednisone taper, hold NSAIDS. Continue heating pad. Consider PT if no improvement.  Sheral Flow, NP

## 2016-07-16 NOTE — Patient Instructions (Signed)
Start prednisone tablets. Take three tablets for 3 days, then two tablets for 3 days, then one tablet for 3 days.  Continue application of heating pad.  Refrain from using ibuprofen until you've completed the prednisone. You may take the muscle relaxer or tylenol for breakthrough pain.  Please notify me if no improvement after 1 week.  It was a pleasure to see you today!   Back Pain, Adult Back pain is very common in adults.The cause of back pain is rarely dangerous and the pain often gets better over time.The cause of your back pain may not be known. Some common causes of back pain include:  Strain of the muscles or ligaments supporting the spine.  Wear and tear (degeneration) of the spinal disks.  Arthritis.  Direct injury to the back. For many people, back pain may return. Since back pain is rarely dangerous, most people can learn to manage this condition on their own. Follow these instructions at home: Watch your back pain for any changes. The following actions may help to lessen any discomfort you are feeling:  Remain active. It is stressful on your back to sit or stand in one place for long periods of time. Do not sit, drive, or stand in one place for more than 30 minutes at a time. Take short walks on even surfaces as soon as you are able.Try to increase the length of time you walk each day.  Exercise regularly as directed by your health care provider. Exercise helps your back heal faster. It also helps avoid future injury by keeping your muscles strong and flexible.  Do not stay in bed.Resting more than 1-2 days can delay your recovery.  Pay attention to your body when you bend and lift. The most comfortable positions are those that put less stress on your recovering back. Always use proper lifting techniques, including:  Bending your knees.  Keeping the load close to your body.  Avoiding twisting.  Find a comfortable position to sleep. Use a firm mattress and lie  on your side with your knees slightly bent. If you lie on your back, put a pillow under your knees.  Avoid feeling anxious or stressed.Stress increases muscle tension and can worsen back pain.It is important to recognize when you are anxious or stressed and learn ways to manage it, such as with exercise.  Take medicines only as directed by your health care provider. Over-the-counter medicines to reduce pain and inflammation are often the most helpful.Your health care provider may prescribe muscle relaxant drugs.These medicines help dull your pain so you can more quickly return to your normal activities and healthy exercise.  Apply ice to the injured area:  Put ice in a plastic bag.  Place a towel between your skin and the bag.  Leave the ice on for 20 minutes, 2-3 times a day for the first 2-3 days. After that, ice and heat may be alternated to reduce pain and spasms.  Maintain a healthy weight. Excess weight puts extra stress on your back and makes it difficult to maintain good posture. Contact a health care provider if:  You have pain that is not relieved with rest or medicine.  You have increasing pain going down into the legs or buttocks.  You have pain that does not improve in one week.  You have night pain.  You lose weight.  You have a fever or chills. Get help right away if:  You develop new bowel or bladder control problems.  You have  unusual weakness or numbness in your arms or legs.  You develop nausea or vomiting.  You develop abdominal pain.  You feel faint. This information is not intended to replace advice given to you by your health care provider. Make sure you discuss any questions you have with your health care provider. Document Released: 06/17/2005 Document Revised: 10/26/2015 Document Reviewed: 10/19/2013 Elsevier Interactive Patient Education  2017 [employer].

## 2016-07-16 NOTE — Progress Notes (Signed)
Pre visit review using our clinic review tool, if applicable. No additional management support is needed unless otherwise documented below in the visit note. 

## 2016-07-17 ENCOUNTER — Encounter: Payer: Self-pay | Admitting: Primary Care

## 2016-07-19 ENCOUNTER — Other Ambulatory Visit: Payer: Self-pay | Admitting: Primary Care

## 2016-07-19 DIAGNOSIS — M545 Low back pain, unspecified: Secondary | ICD-10-CM

## 2016-07-19 MED ORDER — METHOCARBAMOL 500 MG PO TABS: 500.0000 mg | ORAL_TABLET | Freq: Three times a day (TID) | ORAL | 0 refills | Status: DC | PRN

## 2016-08-05 ENCOUNTER — Encounter: Payer: Self-pay | Admitting: Primary Care

## 2016-08-05 ENCOUNTER — Other Ambulatory Visit: Payer: Self-pay | Admitting: Primary Care

## 2016-08-05 DIAGNOSIS — Z20828 Contact with and (suspected) exposure to other viral communicable diseases: Secondary | ICD-10-CM

## 2016-08-05 MED ORDER — OSELTAMIVIR PHOSPHATE 75 MG PO CAPS: 75.0000 mg | ORAL_CAPSULE | Freq: Every day | ORAL | 0 refills | Status: DC

## 2016-09-17 ENCOUNTER — Encounter: Payer: Self-pay | Admitting: Primary Care

## 2016-09-18 DIAGNOSIS — Z01419 Encounter for gynecological examination (general) (routine) without abnormal findings: Secondary | ICD-10-CM | POA: Diagnosis not present

## 2016-09-18 DIAGNOSIS — Z6830 Body mass index (BMI) 30.0-30.9, adult: Secondary | ICD-10-CM | POA: Diagnosis not present

## 2016-09-26 DIAGNOSIS — Z1231 Encounter for screening mammogram for malignant neoplasm of breast: Secondary | ICD-10-CM | POA: Diagnosis not present

## 2016-09-30 ENCOUNTER — Encounter: Payer: Self-pay | Admitting: Primary Care

## 2016-09-30 ENCOUNTER — Other Ambulatory Visit: Payer: Self-pay | Admitting: Obstetrics and Gynecology

## 2016-09-30 DIAGNOSIS — R928 Other abnormal and inconclusive findings on diagnostic imaging of breast: Secondary | ICD-10-CM

## 2016-10-10 ENCOUNTER — Ambulatory Visit
Admission: RE | Admit: 2016-10-10 | Discharge: 2016-10-10 | Disposition: A | Discharge status: Home / Self Care | Payer: BLUE CROSS/BLUE SHIELD | Source: Ambulatory Visit | Attending: Obstetrics and Gynecology | Admitting: Obstetrics and Gynecology

## 2016-10-10 DIAGNOSIS — N6002 Solitary cyst of left breast: Secondary | ICD-10-CM | POA: Diagnosis not present

## 2016-10-10 DIAGNOSIS — R928 Other abnormal and inconclusive findings on diagnostic imaging of breast: Secondary | ICD-10-CM

## 2016-10-10 DIAGNOSIS — N6322 Unspecified lump in the left breast, upper inner quadrant: Secondary | ICD-10-CM | POA: Diagnosis not present

## 2016-10-10 DIAGNOSIS — N6001 Solitary cyst of right breast: Secondary | ICD-10-CM | POA: Diagnosis not present

## 2016-11-29 ENCOUNTER — Other Ambulatory Visit: Payer: Self-pay | Admitting: Primary Care

## 2017-02-24 ENCOUNTER — Other Ambulatory Visit: Payer: Self-pay | Admitting: Primary Care

## 2017-05-12 ENCOUNTER — Other Ambulatory Visit: Payer: Self-pay | Admitting: Primary Care

## 2017-08-08 ENCOUNTER — Other Ambulatory Visit: Payer: Self-pay | Admitting: Primary Care

## 2017-10-10 DIAGNOSIS — Z01419 Encounter for gynecological examination (general) (routine) without abnormal findings: Secondary | ICD-10-CM | POA: Diagnosis not present

## 2017-10-10 DIAGNOSIS — Z6830 Body mass index (BMI) 30.0-30.9, adult: Secondary | ICD-10-CM | POA: Diagnosis not present

## 2017-11-01 ENCOUNTER — Other Ambulatory Visit: Payer: Self-pay | Admitting: Primary Care

## 2017-11-10 ENCOUNTER — Other Ambulatory Visit: Payer: Self-pay | Admitting: Primary Care

## 2017-11-11 ENCOUNTER — Other Ambulatory Visit: Payer: Self-pay | Admitting: Primary Care

## 2017-11-11 MED ORDER — AMLODIPINE BESYLATE 5 MG PO TABS: 5.0000 mg | ORAL_TABLET | Freq: Every day | ORAL | 0 refills | Status: DC

## 2017-12-02 ENCOUNTER — Other Ambulatory Visit: Payer: Self-pay | Admitting: Obstetrics and Gynecology

## 2017-12-02 DIAGNOSIS — N6310 Unspecified lump in the right breast, unspecified quadrant: Secondary | ICD-10-CM

## 2017-12-02 DIAGNOSIS — N6459 Other signs and symptoms in breast: Secondary | ICD-10-CM | POA: Diagnosis not present

## 2017-12-05 ENCOUNTER — Ambulatory Visit
Admission: RE | Admit: 2017-12-05 | Discharge: 2017-12-05 | Disposition: A | Discharge status: Home / Self Care | Payer: BLUE CROSS/BLUE SHIELD | Source: Ambulatory Visit | Attending: Obstetrics and Gynecology | Admitting: Obstetrics and Gynecology

## 2017-12-05 DIAGNOSIS — N6310 Unspecified lump in the right breast, unspecified quadrant: Secondary | ICD-10-CM

## 2017-12-05 DIAGNOSIS — R922 Inconclusive mammogram: Secondary | ICD-10-CM | POA: Diagnosis not present

## 2017-12-05 DIAGNOSIS — N6489 Other specified disorders of breast: Secondary | ICD-10-CM | POA: Diagnosis not present

## 2018-01-07 ENCOUNTER — Ambulatory Visit: Payer: BLUE CROSS/BLUE SHIELD | Admitting: Primary Care

## 2018-01-21 ENCOUNTER — Ambulatory Visit: Payer: BLUE CROSS/BLUE SHIELD | Admitting: Primary Care

## 2018-01-21 VITALS — BP 144/94 | HR 75 | Temp 98.8°F | Ht 64.25 in | Wt 174.0 lb

## 2018-01-21 DIAGNOSIS — F411 Generalized anxiety disorder: Secondary | ICD-10-CM | POA: Insufficient documentation

## 2018-01-21 DIAGNOSIS — I10 Essential (primary) hypertension: Secondary | ICD-10-CM | POA: Diagnosis not present

## 2018-01-21 MED ORDER — ESCITALOPRAM OXALATE 10 MG PO TABS: 10.0000 mg | ORAL_TABLET | Freq: Every day | ORAL | 1 refills | Status: DC

## 2018-01-21 MED ORDER — AMLODIPINE BESYLATE 5 MG PO TABS: 5.0000 mg | ORAL_TABLET | Freq: Every day | ORAL | 0 refills | Status: DC

## 2018-01-21 NOTE — Assessment & Plan Note (Signed)
Above goal in the office today. Will have her start monitoring home BP and have her bring readings to her next appointment in 6 weeks. Refill sent for Amlodipine, consider increasing dose to 10 mg. BMP next visit.

## 2018-01-21 NOTE — Assessment & Plan Note (Signed)
Symptoms for years, no formal diagnosis or treatment.  GAD 7 score of 12 today. Discussed different options and she elects for medication. She will consider therapy in the future.  Rx for Lexapro 10 mg sent to pharmacy. Patient is to take 1/2 tablet daily for 8 days, then advance to 1 full tablet thereafter. We discussed possible side effects of headache, GI upset, drowsiness, and SI/HI. If thoughts of SI/HI develop, we discussed to present to the emergency immediately. Patient verbalized understanding.   Follow up in 6 weeks for re-evaluation.

## 2018-01-21 NOTE — Patient Instructions (Signed)
I sent refills for Amlodipine to your pharmacy.   Start monitoring your blood pressure several times weekly, around the same time of day, until your next visit.  Ensure that you have rested for 30 minutes prior to checking your blood pressure. Record your readings and bring them to your next visit.  Start escitalopram (Lexapro) 10 mg tablets for anxiety. Start by taking 1/2 tablet once daily for 8 days, then increase to 1 full tablet thereafter.  Schedule a follow up visit in 6 weeks for further evaluation.   Please message/call me sooner if you have any problems.  It was a pleasure to see you today!

## 2018-01-21 NOTE — Progress Notes (Signed)
Subjective:    Patient ID: Marie Barnett, female    DOB: 1981/04/19, 37 y.o.   MRN: [id number]  HPI  Marie Barnett is a 37 year old female who presents today for follow up.  1) Essential Hypertension: Currently managed on amlodipine 5 mg. She is not checking her BP at home. She denies chest pain, dizziness. She thinks she has white coat syndrome as her BP was elevated at her OB/GYN's office. She does have anxiety for which she thinks is contributing. She endorses a strong family history of hypertension.   BP Readings from Last 3 Encounters:  01/21/18 (!) 144/94  07/16/16 (!) 144/90  08/11/15 138/88   2) Migraines: Currently managed on sumatriptan 50 mg. She's typically takes this once monthly on average. Migraines are suspected to be hormonal as they are intense during the beginning of her menstrual cycle.  She will start with 800 mg of Ibuprofen first and if no relief then will move to sumatriptan. She feels well managed on this medication.   3) Anxiety: No formal diagnosis but has felt anxious for years. Over the last several months she believes her symptoms have progressed. Symptoms include difficulty sleeping, mind racing thoughts, daily worry, irritability. She thinks she may have some OCD as she has routines for which must be completed. Her anxiety will increase if her routine is altered. She's tried taking Melatonin for sleep without improvement. She does have a family history of anxiety. GAD 7 score of 12.   Review of Systems  Eyes: Negative for visual disturbance.  Respiratory: Negative for shortness of breath.   Cardiovascular: Negative for chest pain.  Neurological: Positive for headaches.  Psychiatric/Behavioral: Positive for sleep disturbance. The patient is nervous/anxious.        See HPI       Past Medical History:  Diagnosis Date  . Fibroids    Uterine  . Frequent headaches   . Hypertension   . Kidney stone      Social History   Socioeconomic History  . Marital  status: Married    Spouse name: Not on file  . Number of children: Not on file  . Years of education: Not on file  . Highest education level: Not on file  Occupational History  . Not on file  Social Needs  . Financial resource strain: Not on file  . Food insecurity:    Worry: Not on file    Inability: Not on file  . Transportation needs:    Medical: Not on file    Non-medical: Not on file  Tobacco Use  . Smoking status: Never Smoker  Substance and Sexual Activity  . Alcohol use: Yes    Alcohol/week: 0.0 oz    Comment: social  . Drug use: Not on file  . Sexual activity: Not on file  Lifestyle  . Physical activity:    Days per week: Not on file    Minutes per session: Not on file  . Stress: Not on file  Relationships  . Social connections:    Talks on phone: Not on file    Gets together: Not on file    Attends religious service: Not on file    Active member of club or organization: Not on file    Attends meetings of clubs or organizations: Not on file    Relationship status: Not on file  . Intimate partner violence:    Fear of current or ex partner: Not on file    Emotionally abused:  Not on file    Physically abused: Not on file    Forced sexual activity: Not on file  Other Topics Concern  . Not on file  Social History Narrative   Married.   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   Enjoys spending time at [employer] and pool.     Past Surgical History:  Procedure Laterality Date  . CESAREAN SECTION  2014    Family History  Problem Relation Age of Onset  . Hyperlipidemia Mother   . Hypertension Mother   . Cystic kidney disease Mother   . Hyperlipidemia Father   . Hypertension Father   . Alcohol abuse Brother   . Alcohol abuse Maternal Uncle   . Hyperlipidemia Maternal Grandmother   . Hypertension Maternal Grandmother   . Stroke Maternal Grandmother   . Depression Maternal Grandmother   . Prostate cancer Maternal Grandfather   . Stroke Maternal  Grandfather   . Colon cancer Paternal Grandmother   . Bipolar disorder Neg Hx     Allergies  Allergen Reactions  . Amoxicillin     Current Outpatient Medications on File Prior to Visit  Medication Sig Dispense Refill  . levonorgestrel (MIRENA) 20 MCG/24HR IUD 1 each by Intrauterine route once.    . Multiple Vitamin (MULTIVITAMIN) capsule Take 1 capsule by mouth daily.    Marland Kitchen VITAMIN D, ERGOCALCIFEROL, PO Take by mouth. Taking 2 gummy daily.    . SUMAtriptan (IMITREX) 50 MG tablet TAKE ONE TABLET BY MOUTH AT ONSET OF HEADACHE, MAY REPEAT ONE TABLET IN 2 HRS AS NEEDED  1   No current facility-administered medications on file prior to visit.     BP (!) 144/94   Pulse 75   Temp 98.8 F (37.1 C) (Oral)   Ht 5' 4.25" (1.632 m)   Wt 174 lb (78.9 kg)   LMP 12/29/2017   SpO2 98%   BMI 29.64 kg/m    Objective:   Physical Exam  Constitutional: She appears well-nourished.  Neck: Neck supple.  Cardiovascular: Normal rate and regular rhythm.  Respiratory: Effort normal and breath sounds normal.  Skin: Skin is warm and dry.  Psychiatric: She has a normal mood and affect.           Assessment & Plan:

## 2018-02-05 DIAGNOSIS — F411 Generalized anxiety disorder: Secondary | ICD-10-CM

## 2018-02-05 DIAGNOSIS — G4700 Insomnia, unspecified: Secondary | ICD-10-CM

## 2018-02-06 MED ORDER — TRAZODONE HCL 50 MG PO TABS: 25.0000 mg | ORAL_TABLET | Freq: Every evening | ORAL | 0 refills | Status: DC | PRN

## 2018-02-13 ENCOUNTER — Other Ambulatory Visit: Payer: Self-pay | Admitting: Primary Care

## 2018-02-13 DIAGNOSIS — F411 Generalized anxiety disorder: Secondary | ICD-10-CM

## 2018-03-03 ENCOUNTER — Other Ambulatory Visit: Payer: Self-pay | Admitting: Primary Care

## 2018-03-03 DIAGNOSIS — F411 Generalized anxiety disorder: Secondary | ICD-10-CM

## 2018-03-03 DIAGNOSIS — G4700 Insomnia, unspecified: Secondary | ICD-10-CM

## 2018-03-06 ENCOUNTER — Encounter: Payer: Self-pay | Admitting: Primary Care

## 2018-03-06 ENCOUNTER — Ambulatory Visit: Payer: BLUE CROSS/BLUE SHIELD | Admitting: Primary Care

## 2018-03-06 VITALS — BP 144/84 | HR 70 | Temp 98.5°F | Ht 64.25 in | Wt 176.2 lb

## 2018-03-06 DIAGNOSIS — F411 Generalized anxiety disorder: Secondary | ICD-10-CM | POA: Diagnosis not present

## 2018-03-06 DIAGNOSIS — G4700 Insomnia, unspecified: Secondary | ICD-10-CM

## 2018-03-06 DIAGNOSIS — I10 Essential (primary) hypertension: Secondary | ICD-10-CM

## 2018-03-06 DIAGNOSIS — Z23 Encounter for immunization: Secondary | ICD-10-CM | POA: Diagnosis not present

## 2018-03-06 LAB — COMPREHENSIVE METABOLIC PANEL
ALBUMIN: 4.3 g/dL (ref 3.5–5.2)
ALK PHOS: 72 U/L (ref 39–117)
ALT: 14 U/L (ref 0–35)
AST: 11 U/L (ref 0–37)
BILIRUBIN TOTAL: 0.5 mg/dL (ref 0.2–1.2)
BUN: 12 mg/dL (ref 6–23)
CO2: 33 mEq/L — ABNORMAL HIGH (ref 19–32)
Calcium: 10.6 mg/dL — ABNORMAL HIGH (ref 8.4–10.5)
Chloride: 101 mEq/L (ref 96–112)
Creatinine, Ser: 0.91 mg/dL (ref 0.40–1.20)
GFR: 73.05 mL/min (ref >60.00)
Glucose, Bld: 93 mg/dL (ref 70–99)
POTASSIUM: 3.8 meq/L (ref 3.5–5.1)
Sodium: 139 mEq/L (ref 135–145)
TOTAL PROTEIN: 7.7 g/dL (ref 6.0–8.3)

## 2018-03-06 MED ORDER — ESCITALOPRAM OXALATE 20 MG PO TABS
ORAL_TABLET | ORAL | 0 refills | Status: DC
Start: 2018-03-06 — End: 2018-05-04

## 2018-03-06 MED ORDER — TRAZODONE HCL 50 MG PO TABS: 25.0000 mg | ORAL_TABLET | Freq: Every evening | ORAL | 1 refills | Status: DC | PRN

## 2018-03-06 MED ORDER — AMLODIPINE BESYLATE 10 MG PO TABS: ORAL_TABLET | ORAL | 3 refills | Status: DC

## 2018-03-06 NOTE — Assessment & Plan Note (Signed)
Slight improvement on Lexapro 10 mg, do agree that she may benefit from an increased dose. Denies SI/HI.   Rx for Lexapro 20 mg sent to pharmacy. Continue Trazodone 50 mg PRN. Follow up in 3 weeks.

## 2018-03-06 NOTE — Assessment & Plan Note (Signed)
Above goal in the office today, also with most home readings. Increase Amlodipine to 10 mg. Will have her continue to monitor BP and return in 3 weeks for recheck.  BMP pending today.

## 2018-03-06 NOTE — Patient Instructions (Addendum)
We've increased the dose of your Lexapro from 10 mg to 20 mg. Take 1 tablet by mouth once daily.  We've increased the dose of your Amlodipine form 5 mg to 10 mg. Take 1 tablet by mouth once daily.  Continue to monitor your blood pressure at home for now. Bring your readings to your next visit.  Stop by the lab prior to leaving today. I will notify you of your results once received.   Schedule a follow up visit in 3 weeks for blood pressure check.   It was a pleasure to see you today!

## 2018-03-06 NOTE — Progress Notes (Signed)
Subjective:    Patient ID: Marie Barnett, female    DOB: 1981/05/17, 37 y.o.   MRN: [id number]  HPI  Marie Barnett is a 37 year old female who presents today for follow up.  1) GAD: She was last evaluated in late July 2019 with chronic symptoms of difficulty sleeping, mind racing thoughts, daily worry, irritability. GAD 7 score of 12. She declined therapy so she was initiated on Lexapro 10 mg.  Since her last visit she's noticed slight improvement in symptoms. Positive effects include decrease in worry, decrease in anxiety, decrease in mind racing thoughts. She's doing better since we initiated Trazodone for insomnia. She did initially have some GI upset with Lexapro, this resolved after one week. She denies SI/HI.   2) Essential Hypertension: Currently managed on Amlodipine 5 mg. During her last visit her blood pressure was noted to be elevated, also on a prior visit to her GYN's office. She endorsed white coat syndrome, wasn't checking BP at home. We encouraged her to monitor BP at home and follow up today.  BP Readings from Last 3 Encounters:  03/06/18 (!) 144/84  01/21/18 (!) 144/94  07/16/16 (!) 144/90   Since her last visit she's checked her blood pressure at home which is running 130's-140's/60's-80's. She's compliant to her Amlodipine 5 mg daily. She denies chest pain, dizziness, ankle edema.   Review of Systems  Respiratory: Negative for shortness of breath.   Cardiovascular: Negative for chest pain and leg swelling.  Neurological: Negative for dizziness and headaches.       Past Medical History:  Diagnosis Date  . Fibroids    Uterine  . Frequent headaches   . Hypertension   . Kidney stone      Social History   Socioeconomic History  . Marital status: Married    Spouse name: Not on file  . Number of children: Not on file  . Years of education: Not on file  . Highest education level: Not on file  Occupational History  . Not on file  Social Needs  . Financial  resource strain: Not on file  . Food insecurity:    Worry: Not on file    Inability: Not on file  . Transportation needs:    Medical: Not on file    Non-medical: Not on file  Tobacco Use  . Smoking status: Never Smoker  . Smokeless tobacco: Never Used  Substance and Sexual Activity  . Alcohol use: Yes    Alcohol/week: 0.0 standard drinks    Comment: social  . Drug use: Not on file  . Sexual activity: Not on file  Lifestyle  . Physical activity:    Days per week: Not on file    Minutes per session: Not on file  . Stress: Not on file  Relationships  . Social connections:    Talks on phone: Not on file    Gets together: Not on file    Attends religious service: Not on file    Active member of club or organization: Not on file    Attends meetings of clubs or organizations: Not on file    Relationship status: Not on file  . Intimate partner violence:    Fear of current or ex partner: Not on file    Emotionally abused: Not on file    Physically abused: Not on file    Forced sexual activity: Not on file  Other Topics Concern  . Not on file  Social History Narrative  Married.   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   Enjoys spending time at [employer] and pool.     Past Surgical History:  Procedure Laterality Date  . CESAREAN SECTION  2014    Family History  Problem Relation Age of Onset  . Hyperlipidemia Mother   . Hypertension Mother   . Cystic kidney disease Mother   . Hyperlipidemia Father   . Hypertension Father   . Alcohol abuse Brother   . Alcohol abuse Maternal Uncle   . Hyperlipidemia Maternal Grandmother   . Hypertension Maternal Grandmother   . Stroke Maternal Grandmother   . Depression Maternal Grandmother   . Prostate cancer Maternal Grandfather   . Stroke Maternal Grandfather   . Colon cancer Paternal Grandmother   . Bipolar disorder Neg Hx     Allergies  Allergen Reactions  . Amoxicillin     Current Outpatient Medications on File  Prior to Visit  Medication Sig Dispense Refill  . levonorgestrel (MIRENA) 20 MCG/24HR IUD 1 each by Intrauterine route once.    . Multiple Vitamin (MULTIVITAMIN) capsule Take 1 capsule by mouth daily.    Marland Kitchen VITAMIN D, ERGOCALCIFEROL, PO Take by mouth. Taking 2 gummy daily.    . SUMAtriptan (IMITREX) 50 MG tablet TAKE ONE TABLET BY MOUTH AT ONSET OF HEADACHE, MAY REPEAT ONE TABLET IN 2 HRS AS NEEDED  1   No current facility-administered medications on file prior to visit.     BP (!) 144/84   Pulse 70   Temp 98.5 F (36.9 C) (Oral)   Ht 5' 4.25" (1.632 m)   Wt 176 lb 4 oz (79.9 kg)   LMP 02/26/2018   SpO2 98%   BMI 30.02 kg/m    Objective:   Physical Exam  Constitutional: She appears well-nourished.  Neck: Neck supple.  Cardiovascular: Normal rate and regular rhythm.  Respiratory: Effort normal and breath sounds normal.  Skin: Skin is warm and dry.  Psychiatric: She has a normal mood and affect.           Assessment & Plan:

## 2018-03-06 NOTE — Addendum Note (Signed)
Addended by: Jacqualin Combes on: 03/06/2018 10:33 AM   Modules accepted: Orders

## 2018-03-18 ENCOUNTER — Other Ambulatory Visit: Payer: Self-pay | Admitting: Primary Care

## 2018-03-18 DIAGNOSIS — F411 Generalized anxiety disorder: Secondary | ICD-10-CM

## 2018-03-24 DIAGNOSIS — I10 Essential (primary) hypertension: Secondary | ICD-10-CM

## 2018-03-24 MED ORDER — LISINOPRIL 20 MG PO TABS: ORAL_TABLET | ORAL | 0 refills | Status: DC

## 2018-03-30 ENCOUNTER — Ambulatory Visit: Payer: BLUE CROSS/BLUE SHIELD | Admitting: Primary Care

## 2018-04-11 DIAGNOSIS — I10 Essential (primary) hypertension: Secondary | ICD-10-CM

## 2018-04-13 MED ORDER — LISINOPRIL 20 MG PO TABS: ORAL_TABLET | ORAL | 0 refills | Status: DC

## 2018-04-15 ENCOUNTER — Other Ambulatory Visit: Payer: Self-pay | Admitting: Primary Care

## 2018-04-15 DIAGNOSIS — I10 Essential (primary) hypertension: Secondary | ICD-10-CM

## 2018-04-20 ENCOUNTER — Ambulatory Visit: Payer: BLUE CROSS/BLUE SHIELD | Admitting: Primary Care

## 2018-04-20 DIAGNOSIS — Z3202 Encounter for pregnancy test, result negative: Secondary | ICD-10-CM | POA: Diagnosis not present

## 2018-04-20 DIAGNOSIS — Z30433 Encounter for removal and reinsertion of intrauterine contraceptive device: Secondary | ICD-10-CM | POA: Diagnosis not present

## 2018-05-04 ENCOUNTER — Encounter: Payer: Self-pay | Admitting: Primary Care

## 2018-05-04 ENCOUNTER — Ambulatory Visit: Payer: BLUE CROSS/BLUE SHIELD | Admitting: Primary Care

## 2018-05-04 DIAGNOSIS — G4700 Insomnia, unspecified: Secondary | ICD-10-CM | POA: Diagnosis not present

## 2018-05-04 DIAGNOSIS — F411 Generalized anxiety disorder: Secondary | ICD-10-CM

## 2018-05-04 DIAGNOSIS — I10 Essential (primary) hypertension: Secondary | ICD-10-CM | POA: Diagnosis not present

## 2018-05-04 LAB — BASIC METABOLIC PANEL
BUN: 20 mg/dL (ref 6–23)
CALCIUM: 9.2 mg/dL (ref 8.4–10.5)
CO2: 29 meq/L (ref 19–32)
CREATININE: 0.9 mg/dL (ref 0.40–1.20)
Chloride: 103 mEq/L (ref 96–112)
GFR: 73.93 mL/min (ref >60.00)
Glucose, Bld: 97 mg/dL (ref 70–99)
Potassium: 4.4 mEq/L (ref 3.5–5.1)
SODIUM: 137 meq/L (ref 135–145)

## 2018-05-04 MED ORDER — TRAZODONE HCL 50 MG PO TABS: 25.0000 mg | ORAL_TABLET | Freq: Every evening | ORAL | 3 refills | Status: DC | PRN

## 2018-05-04 MED ORDER — LISINOPRIL 20 MG PO TABS: ORAL_TABLET | ORAL | 3 refills | Status: DC

## 2018-05-04 MED ORDER — ESCITALOPRAM OXALATE 20 MG PO TABS: ORAL_TABLET | ORAL | 3 refills | Status: DC

## 2018-05-04 NOTE — Patient Instructions (Signed)
Continue taking lisinopril 20 mg tablets for high blood pressure.  Continue Lexapro 20 mg and Trazodone 50 mg.   Stop by the lab prior to leaving today. I will notify you of your results once received.   It was a pleasure to see you today!

## 2018-05-04 NOTE — Progress Notes (Signed)
Subjective:    Patient ID: Marie Barnett, female    DOB: [birth year], 37 y.o.   MRN: [id number]  HPI  Marie Barnett is a 37 year old female who presents today for follow up.  1) Essential Hypertension: Currently managed on Amlodipine 10 mg that was increased from 5 mg in early September 2019. She sent a message through My Chart in late September with reports of improved BP readings but with side effects of intolerable ankle edema. Her Amlodipine 10 mg was discontinued and she was switched to lisinopril 20 mg.  BP Readings from Last 3 Encounters:  05/04/18 116/72  03/06/18 (!) 144/84  01/21/18 (!) 144/94   Since switching to lisinopril 20 mg she denies ankle edema. She's checking her BP at home and is getting readings of 120's/70's. She denies chest pain, dizziness, cough.   2) GAD: Currently managed on Lexapro 20 mg which was increased from 10 mg in early September 2019. Trazodone 50 mg PRN was continued.   Since her Lexapro dose was increased to 20 mg she's feeling better. Her anxiety has overall decreased and she's sleeping better. She's noticed that she's able to handle stressful situations much easier than before. She's using Trazodone nightly.   Review of Systems  Respiratory: Negative for shortness of breath.   Cardiovascular: Negative for chest pain.  Neurological: Negative for dizziness and headaches.  Psychiatric/Behavioral: Negative for suicidal ideas.       See HPI        Past Medical History:  Diagnosis Date  . Fibroids    Uterine  . Frequent headaches   . Hypertension   . Kidney stone      Social History   Socioeconomic History  . Marital status: Married    Spouse name: Not on file  . Number of children: Not on file  . Years of education: Not on file  . Highest education level: Not on file  Occupational History  . Not on file  Social Needs  . Financial resource strain: Not on file  . Food insecurity:    Worry: Not on file    Inability: Not on file  .  Transportation needs:    Medical: Not on file    Non-medical: Not on file  Tobacco Use  . Smoking status: Never Smoker  . Smokeless tobacco: Never Used  Substance and Sexual Activity  . Alcohol use: Yes    Alcohol/week: 0.0 standard drinks    Comment: social  . Drug use: Not on file  . Sexual activity: Not on file  Lifestyle  . Physical activity:    Days per week: Not on file    Minutes per session: Not on file  . Stress: Not on file  Relationships  . Social connections:    Talks on phone: Not on file    Gets together: Not on file    Attends religious service: Not on file    Active member of club or organization: Not on file    Attends meetings of clubs or organizations: Not on file    Relationship status: Not on file  . Intimate partner violence:    Fear of current or ex partner: Not on file    Emotionally abused: Not on file    Physically abused: Not on file    Forced sexual activity: Not on file  Other Topics Concern  . Not on file  Social History Narrative   Married.   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   Enjoys spending time at [employer] and pool.     Past Surgical History:  Procedure Laterality Date  . CESAREAN SECTION  2014    Family History  Problem Relation Age of Onset  . Hyperlipidemia Mother   . Hypertension Mother   . Cystic kidney disease Mother   . Hyperlipidemia Father   . Hypertension Father   . Alcohol abuse Brother   . Alcohol abuse Maternal Uncle   . Hyperlipidemia Maternal Grandmother   . Hypertension Maternal Grandmother   . Stroke Maternal Grandmother   . Depression Maternal Grandmother   . Prostate cancer Maternal Grandfather   . Stroke Maternal Grandfather   . Colon cancer Paternal Grandmother   . Bipolar disorder Neg Hx     Allergies  Allergen Reactions  . Amoxicillin   . Amlodipine Swelling    Current Outpatient Medications on File Prior to Visit  Medication Sig Dispense Refill  . levonorgestrel (MIRENA) 20  MCG/24HR IUD 1 each by Intrauterine route once.    . Multiple Vitamin (MULTIVITAMIN) capsule Take 1 capsule by mouth daily.    Marland Kitchen VITAMIN D, ERGOCALCIFEROL, PO Take by mouth. Taking 2 gummy daily.     No current facility-administered medications on file prior to visit.     BP 116/72   Pulse 68   Temp 98.5 F (36.9 C) (Oral)   Ht 5' 4.25" (1.632 m)   Wt 182 lb 8 oz (82.8 kg)   SpO2 98%   BMI 31.08 kg/m    Objective:   Physical Exam  Constitutional: She appears well-nourished.  Neck: Neck supple.  Cardiovascular: Normal rate and regular rhythm.  Respiratory: Effort normal and breath sounds normal.  Skin: Skin is warm and dry.  Psychiatric: She has a normal mood and affect.           Assessment & Plan:

## 2018-05-04 NOTE — Assessment & Plan Note (Signed)
Improved on lisinopril 20 mg, continue same. BMP pending. Refills sent to pharmacy.

## 2018-05-04 NOTE — Assessment & Plan Note (Signed)
Improved on Lexapro 20 mg, continue same. Refills sent to pharmacy. Continue Trazodone 50 mg HS. Denies SI/HI.

## 2018-08-03 ENCOUNTER — Other Ambulatory Visit: Payer: Self-pay | Admitting: Primary Care

## 2018-08-03 DIAGNOSIS — I10 Essential (primary) hypertension: Secondary | ICD-10-CM

## 2018-08-03 DIAGNOSIS — Z0000 Encounter for general adult medical examination without abnormal findings: Secondary | ICD-10-CM

## 2018-08-03 DIAGNOSIS — E785 Hyperlipidemia, unspecified: Secondary | ICD-10-CM

## 2018-08-10 ENCOUNTER — Other Ambulatory Visit: Payer: BLUE CROSS/BLUE SHIELD

## 2018-08-17 ENCOUNTER — Encounter: Payer: BLUE CROSS/BLUE SHIELD | Admitting: Primary Care

## 2018-08-24 ENCOUNTER — Encounter: Payer: BLUE CROSS/BLUE SHIELD | Admitting: Primary Care

## 2018-08-26 DIAGNOSIS — Z20828 Contact with and (suspected) exposure to other viral communicable diseases: Secondary | ICD-10-CM

## 2018-08-26 MED ORDER — OSELTAMIVIR PHOSPHATE 75 MG PO CAPS: 75.0000 mg | ORAL_CAPSULE | Freq: Every day | ORAL | 0 refills | Status: AC

## 2018-09-22 NOTE — Telephone Encounter (Signed)
Marie Barnett, will you get her set up for phone or video visit?

## 2018-09-22 NOTE — Telephone Encounter (Signed)
Lvm asking pt to call office 

## 2019-01-14 ENCOUNTER — Other Ambulatory Visit: Payer: Self-pay | Admitting: Obstetrics and Gynecology

## 2019-01-14 DIAGNOSIS — Z1231 Encounter for screening mammogram for malignant neoplasm of breast: Secondary | ICD-10-CM

## 2019-03-02 ENCOUNTER — Ambulatory Visit: Payer: BLUE CROSS/BLUE SHIELD

## 2019-03-03 ENCOUNTER — Ambulatory Visit (INDEPENDENT_AMBULATORY_CARE_PROVIDER_SITE_OTHER): Payer: BC Managed Care – PPO

## 2019-03-03 DIAGNOSIS — Z23 Encounter for immunization: Secondary | ICD-10-CM | POA: Diagnosis not present

## 2019-03-25 ENCOUNTER — Ambulatory Visit
Admission: RE | Admit: 2019-03-25 | Discharge: 2019-03-25 | Disposition: A | Discharge status: Home / Self Care | Payer: BC Managed Care – PPO | Source: Ambulatory Visit | Attending: Obstetrics and Gynecology | Admitting: Obstetrics and Gynecology

## 2019-03-25 ENCOUNTER — Other Ambulatory Visit: Payer: Self-pay

## 2019-03-25 DIAGNOSIS — Z1231 Encounter for screening mammogram for malignant neoplasm of breast: Secondary | ICD-10-CM | POA: Diagnosis not present

## 2019-03-29 ENCOUNTER — Other Ambulatory Visit: Payer: Self-pay | Admitting: Obstetrics and Gynecology

## 2019-03-29 DIAGNOSIS — R928 Other abnormal and inconclusive findings on diagnostic imaging of breast: Secondary | ICD-10-CM

## 2019-03-30 ENCOUNTER — Ambulatory Visit
Admission: RE | Admit: 2019-03-30 | Discharge: 2019-03-30 | Disposition: A | Discharge status: Home / Self Care | Payer: BC Managed Care – PPO | Source: Ambulatory Visit | Attending: Obstetrics and Gynecology | Admitting: Obstetrics and Gynecology

## 2019-03-30 ENCOUNTER — Other Ambulatory Visit: Payer: Self-pay

## 2019-03-30 DIAGNOSIS — R922 Inconclusive mammogram: Secondary | ICD-10-CM | POA: Diagnosis not present

## 2019-03-30 DIAGNOSIS — R928 Other abnormal and inconclusive findings on diagnostic imaging of breast: Secondary | ICD-10-CM

## 2019-03-30 DIAGNOSIS — N6001 Solitary cyst of right breast: Secondary | ICD-10-CM | POA: Diagnosis not present

## 2019-04-21 DIAGNOSIS — Z6834 Body mass index (BMI) 34.0-34.9, adult: Secondary | ICD-10-CM | POA: Diagnosis not present

## 2019-04-21 DIAGNOSIS — Z01419 Encounter for gynecological examination (general) (routine) without abnormal findings: Secondary | ICD-10-CM | POA: Diagnosis not present

## 2019-05-03 DIAGNOSIS — G4700 Insomnia, unspecified: Secondary | ICD-10-CM

## 2019-05-03 DIAGNOSIS — I10 Essential (primary) hypertension: Secondary | ICD-10-CM

## 2019-05-03 DIAGNOSIS — F411 Generalized anxiety disorder: Secondary | ICD-10-CM

## 2019-05-03 MED ORDER — LISINOPRIL 20 MG PO TABS: ORAL_TABLET | ORAL | 0 refills | Status: DC

## 2019-05-03 MED ORDER — TRAZODONE HCL 50 MG PO TABS: 25.0000 mg | ORAL_TABLET | Freq: Every evening | ORAL | 0 refills | Status: DC | PRN

## 2019-05-03 MED ORDER — ESCITALOPRAM OXALATE 20 MG PO TABS: ORAL_TABLET | ORAL | 0 refills | Status: DC

## 2019-05-03 NOTE — Telephone Encounter (Signed)
Rx were last refill on 05/05/2019. Patient has not been seen since and canceled the last 3 appointments.  Please advise.

## 2019-05-06 NOTE — Telephone Encounter (Signed)
Vallarie Mare, will you find out what happened with these Rx's? I sent all three on 05/03/19.

## 2019-05-07 NOTE — Telephone Encounter (Signed)
Per DPR, left detail message for patient that Rx are ready for pick up

## 2019-05-09 ENCOUNTER — Other Ambulatory Visit: Payer: Self-pay | Admitting: Primary Care

## 2019-05-09 DIAGNOSIS — I10 Essential (primary) hypertension: Secondary | ICD-10-CM

## 2019-05-15 ENCOUNTER — Other Ambulatory Visit: Payer: Self-pay | Admitting: Primary Care

## 2019-05-15 DIAGNOSIS — F411 Generalized anxiety disorder: Secondary | ICD-10-CM

## 2019-05-19 ENCOUNTER — Telehealth (INDEPENDENT_AMBULATORY_CARE_PROVIDER_SITE_OTHER): Payer: BC Managed Care – PPO | Admitting: Primary Care

## 2019-05-19 ENCOUNTER — Encounter: Payer: Self-pay | Admitting: Primary Care

## 2019-05-19 ENCOUNTER — Telehealth: Payer: BC Managed Care – PPO | Admitting: Primary Care

## 2019-05-19 VITALS — BP 123/79

## 2019-05-19 DIAGNOSIS — F411 Generalized anxiety disorder: Secondary | ICD-10-CM

## 2019-05-19 DIAGNOSIS — I10 Essential (primary) hypertension: Secondary | ICD-10-CM | POA: Diagnosis not present

## 2019-05-19 DIAGNOSIS — E785 Hyperlipidemia, unspecified: Secondary | ICD-10-CM

## 2019-05-19 MED ORDER — CITALOPRAM HYDROBROMIDE 20 MG PO TABS: 20.0000 mg | ORAL_TABLET | Freq: Every day | ORAL | 1 refills | Status: DC

## 2019-05-19 NOTE — Progress Notes (Signed)
Subjective:    Patient ID: Marie Barnett, female    DOB: 09/04/1980, 38 y.o.   MRN: [id number]  HPI  Virtual Visit via Video Note  I connected with Marie Barnett on 05/19/19 at  2:00 PM EST by a video enabled telemedicine application and verified that I am speaking with the correct person using two identifiers.  Location: Patient: Home Provider: Office   I discussed the limitations of evaluation and management by telemedicine and the availability of in person appointments. The patient expressed understanding and agreed to proceed.  History of Present Illness:  Marie Barnett is a 38 year old female who presents today for follow up of chronic conditions and medication refills.  1) Essential Hypertension: Currently managed on lisinopril 20 mg. She denies chest pain, dizziness, cough. She had her BP checked at her GYN's office last week which was 123/79.  BP Readings from Last 3 Encounters:  05/19/19 123/79  05/04/18 116/72  03/06/18 (!) 144/84    2) GAD: Currently managed on Lexapro 20 mg and Trazodone 50 mg. She feels as though the Lexapro isn't helping any longer for anxiety. She also doesn't feel as though the Trazodone is effective any longer, is now up to 100 mg and will lay awake at night.   She does have mind racing thoughts when trying to sleep, believes that anxiety is playing a role. She will have difficulty falling asleep and will have a restless sleep.   Other symptoms include daily worry, "trying to solve the worlds problems", palpitations, increased stress with virtual learning with her three children, increased stress with Covid-19. She does feel as though the Lexapro helped initially but over the last several months doesn't feel as though it's as effective.    Observations/Objective:  Alert and oriented. Appears well, not sickly. No distress. Speaking in complete sentences.   Assessment and Plan:  See problem based charting  Follow Up Instructions:  Start  citalopram 20 mg tablets for anxiety. Reduce your Lexapro to 1/2 tablet for 5 days, start 1/2 tablet of citalopram for 5 days. Increase citalopram to 1 full tablet on day 6, stop Lexapro on day 6.  Continue Trazodone as discussed.  Schedule a lab only appointment as discussed.  Please update me in four weeks.  It was nice to see you! [name], NP-C    I discussed the assessment and treatment plan with the patient. The patient was provided an opportunity to ask questions and all were answered. The patient agreed with the plan and demonstrated an understanding of the instructions.   The patient was advised to call back or seek an in-person evaluation if the symptoms worsen or if the condition fails to improve as anticipated.    [name], NP    Review of Systems  Eyes: Negative for visual disturbance.  Respiratory: Negative for shortness of breath.   Cardiovascular: Negative for chest pain.  Neurological: Negative for dizziness and headaches.  Psychiatric/Behavioral: Positive for sleep disturbance. The patient is nervous/anxious.        See HPI       Past Medical History:  Diagnosis Date  . Fibroids    Uterine  . Frequent headaches   . Hypertension   . Kidney stone      Social History   Socioeconomic History  . Marital status: Married    Spouse name: Not on file  . Number of children: Not on file  . Years of education: Not on file  . Highest  education level: Not on file  Occupational History  . Not on file  Social Needs  . Financial resource strain: Not on file  . Food insecurity    Worry: Not on file    Inability: Not on file  . Transportation needs    Medical: Not on file    Non-medical: Not on file  Tobacco Use  . Smoking status: Never Smoker  . Smokeless tobacco: Never Used  Substance and Sexual Activity  . Alcohol use: Yes    Alcohol/week: 0.0 standard drinks    Comment: social  . Drug use: Not on file  . Sexual activity: Not on file   Lifestyle  . Physical activity    Days per week: Not on file    Minutes per session: Not on file  . Stress: Not on file  Relationships  . Social Herbalist on phone: Not on file    Gets together: Not on file    Attends religious service: Not on file    Active member of club or organization: Not on file    Attends meetings of clubs or organizations: Not on file    Relationship status: Not on file  . Intimate partner violence    Fear of current or ex partner: Not on file    Emotionally abused: Not on file    Physically abused: Not on file    Forced sexual activity: Not on file  Other Topics Concern  . Not on file  Social History Narrative   Married.   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   Enjoys spending time at [employer] and pool.     Past Surgical History:  Procedure Laterality Date  . CESAREAN SECTION  2014    Family History  Problem Relation Age of Onset  . Hyperlipidemia Mother   . Hypertension Mother   . Cystic kidney disease Mother   . Hyperlipidemia Father   . Hypertension Father   . Alcohol abuse Brother   . Alcohol abuse Maternal Uncle   . Hyperlipidemia Maternal Grandmother   . Hypertension Maternal Grandmother   . Stroke Maternal Grandmother   . Depression Maternal Grandmother   . Prostate cancer Maternal Grandfather   . Stroke Maternal Grandfather   . Colon cancer Paternal Grandmother   . Bipolar disorder Neg Hx     Allergies  Allergen Reactions  . Amoxicillin Hives  . Amlodipine Swelling    Current Outpatient Medications on File Prior to Visit  Medication Sig Dispense Refill  . levonorgestrel (MIRENA) 20 MCG/24HR IUD 1 each by Intrauterine route once.    Marland Kitchen lisinopril (ZESTRIL) 20 MG tablet Take 1 tablet by mouth once daily for blood pressure. 30 tablet 0  . Multiple Vitamin (MULTIVITAMIN) capsule Take 1 capsule by mouth daily.    . traZODone (DESYREL) 50 MG tablet Take 0.5-1 tablets (25-50 mg total) by mouth at bedtime as  needed for sleep. 30 tablet 0  . VITAMIN D, ERGOCALCIFEROL, PO Take by mouth. Taking 2 gummy daily.     No current facility-administered medications on file prior to visit.     BP 123/79    Objective:   Physical Exam  Constitutional: She is oriented to person, place, and time. She appears well-nourished.  Respiratory: Effort normal.  Neurological: She is alert and oriented to person, place, and time.  Psychiatric: She has a normal mood and affect.           Assessment &  Plan:

## 2019-05-19 NOTE — Patient Instructions (Signed)
Start citalopram 20 mg tablets for anxiety. Reduce your Lexapro to 1/2 tablet for 5 days, start 1/2 tablet of citalopram for 5 days. Increase citalopram to 1 full tablet on day 6, stop Lexapro on day 6.  Continue Trazodone as discussed.  Schedule a lab only appointment as discussed.  Please update me in four weeks.  It was nice to see you! [name], NP-C

## 2019-05-19 NOTE — Assessment & Plan Note (Signed)
Lexapro no longer effective per patient. She has had added stressors with Covid-19, moving, etc.  Will switch to Citalopram. Wean off Lexapro. Instructions sent to patient via My Chart.   Continue Trazodone for now. Add in Melatonin for sleep. She will update in 4 weeks.

## 2019-05-19 NOTE — Assessment & Plan Note (Signed)
Repeat lipids pending.  

## 2019-05-19 NOTE — Assessment & Plan Note (Signed)
Stable from recent GYN appointment. Continue lisinopril 20 mg. CMP pending.

## 2019-05-26 ENCOUNTER — Other Ambulatory Visit: Payer: Self-pay | Admitting: Primary Care

## 2019-05-26 DIAGNOSIS — I10 Essential (primary) hypertension: Secondary | ICD-10-CM

## 2019-05-30 ENCOUNTER — Other Ambulatory Visit: Payer: Self-pay | Admitting: Primary Care

## 2019-05-30 DIAGNOSIS — G4700 Insomnia, unspecified: Secondary | ICD-10-CM

## 2019-05-30 DIAGNOSIS — F411 Generalized anxiety disorder: Secondary | ICD-10-CM

## 2019-06-02 ENCOUNTER — Other Ambulatory Visit: Payer: Self-pay

## 2019-06-02 ENCOUNTER — Other Ambulatory Visit (INDEPENDENT_AMBULATORY_CARE_PROVIDER_SITE_OTHER): Payer: BC Managed Care – PPO

## 2019-06-02 DIAGNOSIS — I10 Essential (primary) hypertension: Secondary | ICD-10-CM

## 2019-06-02 DIAGNOSIS — E785 Hyperlipidemia, unspecified: Secondary | ICD-10-CM | POA: Diagnosis not present

## 2019-06-02 LAB — COMPREHENSIVE METABOLIC PANEL
ALT: 12 U/L (ref 0–35)
AST: 12 U/L (ref 0–37)
Albumin: 3.9 g/dL (ref 3.5–5.2)
Alkaline Phosphatase: 75 U/L (ref 39–117)
BUN: 20 mg/dL (ref 6–23)
CO2: 29 mEq/L (ref 19–32)
Calcium: 9 mg/dL (ref 8.4–10.5)
Chloride: 102 mEq/L (ref 96–112)
Creatinine, Ser: 0.89 mg/dL (ref 0.40–1.20)
GFR: 70.07 mL/min (ref >60.00)
Glucose, Bld: 93 mg/dL (ref 70–99)
Potassium: 4.4 mEq/L (ref 3.5–5.1)
Sodium: 137 mEq/L (ref 135–145)
Total Bilirubin: 0.5 mg/dL (ref 0.2–1.2)
Total Protein: 6.7 g/dL (ref 6.0–8.3)

## 2019-06-02 LAB — LIPID PANEL
Cholesterol: 208 mg/dL — ABNORMAL HIGH (ref 0–200)
HDL: 35 mg/dL — ABNORMAL LOW (ref >39.00)
LDL Cholesterol: 134 mg/dL — ABNORMAL HIGH (ref 0–99)
NonHDL: 173.04
Total CHOL/HDL Ratio: 6
Triglycerides: 194 mg/dL — ABNORMAL HIGH (ref 0.0–149.0)
VLDL: 38.8 mg/dL (ref 0.0–40.0)

## 2019-06-12 ENCOUNTER — Other Ambulatory Visit: Payer: Self-pay | Admitting: Primary Care

## 2019-06-12 DIAGNOSIS — F411 Generalized anxiety disorder: Secondary | ICD-10-CM

## 2019-06-16 DIAGNOSIS — F411 Generalized anxiety disorder: Secondary | ICD-10-CM

## 2019-06-16 MED ORDER — VENLAFAXINE HCL ER 37.5 MG PO CP24: 37.5000 mg | ORAL_CAPSULE | Freq: Every day | ORAL | 1 refills | Status: DC

## 2019-06-21 MED ORDER — HYDROXYZINE HCL 25 MG PO TABS: 25.0000 mg | ORAL_TABLET | Freq: Every evening | ORAL | 0 refills | Status: DC | PRN

## 2019-07-05 DIAGNOSIS — F411 Generalized anxiety disorder: Secondary | ICD-10-CM

## 2019-07-06 MED ORDER — VENLAFAXINE HCL ER 75 MG PO CP24: 75.0000 mg | ORAL_CAPSULE | Freq: Every day | ORAL | 0 refills | Status: DC

## 2019-07-13 ENCOUNTER — Other Ambulatory Visit: Payer: Self-pay | Admitting: Primary Care

## 2019-07-13 DIAGNOSIS — F411 Generalized anxiety disorder: Secondary | ICD-10-CM

## 2019-07-13 NOTE — Telephone Encounter (Signed)
Ok to change? Last prescribed on 06/21/2019 #30 with 0 refills. Last appointment on 1118/2020. No future appointment

## 2019-07-13 NOTE — Telephone Encounter (Signed)
Rx changed per pharmacy request.

## 2019-07-14 DIAGNOSIS — D225 Melanocytic nevi of trunk: Secondary | ICD-10-CM | POA: Diagnosis not present

## 2019-07-14 DIAGNOSIS — D1801 Hemangioma of skin and subcutaneous tissue: Secondary | ICD-10-CM | POA: Diagnosis not present

## 2019-07-14 DIAGNOSIS — L905 Scar conditions and fibrosis of skin: Secondary | ICD-10-CM | POA: Diagnosis not present

## 2019-07-14 DIAGNOSIS — D485 Neoplasm of uncertain behavior of skin: Secondary | ICD-10-CM | POA: Diagnosis not present

## 2019-07-14 DIAGNOSIS — L821 Other seborrheic keratosis: Secondary | ICD-10-CM | POA: Diagnosis not present

## 2019-07-21 DIAGNOSIS — G4700 Insomnia, unspecified: Secondary | ICD-10-CM

## 2019-07-21 DIAGNOSIS — F411 Generalized anxiety disorder: Secondary | ICD-10-CM

## 2019-07-21 MED ORDER — VENLAFAXINE HCL ER 75 MG PO CP24: 75.0000 mg | ORAL_CAPSULE | Freq: Every day | ORAL | 1 refills | Status: DC

## 2019-07-23 MED ORDER — GABAPENTIN 100 MG PO CAPS: ORAL_CAPSULE | ORAL | 0 refills | Status: DC

## 2019-08-06 ENCOUNTER — Telehealth (INDEPENDENT_AMBULATORY_CARE_PROVIDER_SITE_OTHER): Payer: BC Managed Care – PPO | Admitting: Primary Care

## 2019-08-06 ENCOUNTER — Other Ambulatory Visit: Payer: Self-pay

## 2019-08-06 DIAGNOSIS — F411 Generalized anxiety disorder: Secondary | ICD-10-CM | POA: Diagnosis not present

## 2019-08-06 DIAGNOSIS — G4700 Insomnia, unspecified: Secondary | ICD-10-CM

## 2019-08-06 NOTE — Patient Instructions (Signed)
I'll be in touch next week after some research. Continue venlafaxine ER 75mg  for anxiety.   It was a pleasure to see you today!

## 2019-08-06 NOTE — Progress Notes (Signed)
Subjective:    Patient ID: Marie Barnett, female    DOB: August 07, 1980, 39 y.o.   MRN: [id number]  HPI  Virtual Visit via Video Note  I connected with Marie Barnett on 08/06/19 at  3:20 PM EST by a video enabled telemedicine application and verified that I am speaking with the correct person using two identifiers.  Location: Patient: Home Provider: Office   I discussed the limitations of evaluation and management by telemedicine and the availability of in person appointments. The patient expressed understanding and agreed to proceed.  History of Present Illness:  Marie Barnett is a 39 year old female with a history of anxiety disorder and insomnia who presents today to discuss ongoing insomnia.  She is currently managed on venlafaxine XR 75 mg which was increased about one month ago. Overall she's doing better in terms of anxiety during the day but continues to struggle with sleep.  She's tried taking Melatonin, Calm Aid (natural substance), gabapentin, Trazodone, hydroxyzine without improvement. She's also tried reading, watching TV.   She mostly struggles with falling asleep, will sometimes wake during the night and have trouble falling back asleep. She doesn't feel very anzious at night but does find herself with racing thoughts while lying awake. She's laying awake all night, feel asleep at 7 am this morning, had to get up at 8:30 am.   When taking Trazodone 100 mg she would lay awake until 3 am. She's now up to 300 mg of gabapentin at bedtime without improvement. She has recently been reading prior to bedtime, no screen time 30 minutes prior to sleep. She has sweet tea with dinner which is the last bit of caffeine during the day.    Observations/Objective:  Alert and oriented. Appears well, not sickly. No distress. Speaking in complete sentences.   Assessment and Plan:  See problem based charting.  Follow Up Instructions:  I'll be in touch next week after some  research. Continue venlafaxine ER 75mg  for anxiety.   It was a pleasure to see you today!    I discussed the assessment and treatment plan with the patient. The patient was provided an opportunity to ask questions and all were answered. The patient agreed with the plan and demonstrated an understanding of the instructions.   The patient was advised to call back or seek an in-person evaluation if the symptoms worsen or if the condition fails to improve as anticipated.   [name], NP    Review of Systems  Respiratory: Negative for shortness of breath.   Cardiovascular: Negative for chest pain.  Psychiatric/Behavioral:       See HPI       Past Medical History:  Diagnosis Date  . Fibroids    Uterine  . Frequent headaches   . Hypertension   . Kidney stone      Social History   Socioeconomic History  . Marital status: Married    Spouse name: Not on file  . Number of children: Not on file  . Years of education: Not on file  . Highest education level: Not on file  Occupational History  . Not on file  Tobacco Use  . Smoking status: Never Smoker  . Smokeless tobacco: Never Used  Substance and Sexual Activity  . Alcohol use: Yes    Alcohol/week: 0.0 standard drinks    Comment: social  . Drug use: Not on file  . Sexual activity: Not on file  Other Topics Concern  . Not on file  Social History Narrative   Married.   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   Enjoys spending time at [employer] and pool.    Social Determinants of Health   Financial Resource Strain:   . Difficulty of Paying Living Expenses: Not on file  Food Insecurity:   . Worried About Charity fundraiser in the Last Year: Not on file  . Ran Out of Food in the Last Year: Not on file  Transportation Needs:   . Lack of Transportation (Medical): Not on file  . Lack of Transportation (Non-Medical): Not on file  Physical Activity:   . Days of Exercise per Week: Not on file  . Minutes of  Exercise per Session: Not on file  Stress:   . Feeling of Stress : Not on file  Social Connections:   . Frequency of Communication with Friends and Family: Not on file  . Frequency of Social Gatherings with Friends and Family: Not on file  . Attends Religious Services: Not on file  . Active Member of Clubs or Organizations: Not on file  . Attends Archivist Meetings: Not on file  . Marital Status: Not on file  Intimate Partner Violence:   . Fear of Current or Ex-Partner: Not on file  . Emotionally Abused: Not on file  . Physically Abused: Not on file  . Sexually Abused: Not on file    Past Surgical History:  Procedure Laterality Date  . CESAREAN SECTION  2014    Family History  Problem Relation Age of Onset  . Hyperlipidemia Mother   . Hypertension Mother   . Cystic kidney disease Mother   . Hyperlipidemia Father   . Hypertension Father   . Alcohol abuse Brother   . Alcohol abuse Maternal Uncle   . Hyperlipidemia Maternal Grandmother   . Hypertension Maternal Grandmother   . Stroke Maternal Grandmother   . Depression Maternal Grandmother   . Prostate cancer Maternal Grandfather   . Stroke Maternal Grandfather   . Colon cancer Paternal Grandmother   . Bipolar disorder Neg Hx     Allergies  Allergen Reactions  . Amoxicillin Hives  . Amlodipine Swelling    Current Outpatient Medications on File Prior to Visit  Medication Sig Dispense Refill  . gabapentin (NEURONTIN) 100 MG capsule Take 1 to 3 capsules at bedtime for sleep/anxiety. 30 capsule 0  . levonorgestrel (MIRENA) 20 MCG/24HR IUD 1 each by Intrauterine route once.    Marland Kitchen lisinopril (ZESTRIL) 20 MG tablet TAKE 1 TABLET BY MOUTH EVERY DAY FOR BLOOD PRESSURE 90 tablet 1  . Multiple Vitamin (MULTIVITAMIN) capsule Take 1 capsule by mouth daily.    Marland Kitchen venlafaxine XR (EFFEXOR-XR) 75 MG 24 hr capsule Take 1 capsule (75 mg total) by mouth daily with breakfast. For anxiety. 90 capsule 1  . VITAMIN D,  ERGOCALCIFEROL, PO Take by mouth. Taking 2 gummy daily.     No current facility-administered medications on file prior to visit.    There were no vitals taken for this visit.   Objective:   Physical Exam  Constitutional: She is oriented to person, place, and time. She appears well-nourished.  Respiratory: Effort normal.  Neurological: She is alert and oriented to person, place, and time.  Psychiatric: She has a normal mood and affect.           Assessment & Plan:

## 2019-08-06 NOTE — Assessment & Plan Note (Signed)
Improved and doing better on venlafaxine 75 mg.  Continue same for now.

## 2019-08-06 NOTE — Assessment & Plan Note (Addendum)
Uncontrolled. Failed numerous medications such as Melatonin, Calm Aid, Trazodone, hydroxyzine, gabapentin.   Daily anxiety has improved during the day with venlafaxine 75 mg.   Would like to avoid mirtazapine due to effects of weight gain. Will consult with supervising physician.

## 2019-08-09 DIAGNOSIS — F411 Generalized anxiety disorder: Secondary | ICD-10-CM

## 2019-08-09 DIAGNOSIS — G4700 Insomnia, unspecified: Secondary | ICD-10-CM

## 2019-08-09 MED ORDER — AMITRIPTYLINE HCL 25 MG PO TABS: 25.0000 mg | ORAL_TABLET | Freq: Every day | ORAL | 0 refills | Status: DC

## 2019-08-13 ENCOUNTER — Other Ambulatory Visit: Payer: Self-pay

## 2019-08-13 ENCOUNTER — Other Ambulatory Visit (INDEPENDENT_AMBULATORY_CARE_PROVIDER_SITE_OTHER): Payer: BC Managed Care – PPO

## 2019-08-13 DIAGNOSIS — G4700 Insomnia, unspecified: Secondary | ICD-10-CM | POA: Diagnosis not present

## 2019-08-14 LAB — TSH: TSH: 1.13 mIU/L

## 2019-08-17 MED ORDER — ZOLPIDEM TARTRATE 5 MG PO TABS: 5.0000 mg | ORAL_TABLET | Freq: Every evening | ORAL | 0 refills | Status: DC | PRN

## 2019-08-17 MED ORDER — BELSOMRA 10 MG PO TABS: 10.0000 mg | ORAL_TABLET | Freq: Every evening | ORAL | 0 refills | Status: DC | PRN

## 2019-08-17 MED ORDER — VENLAFAXINE HCL ER 150 MG PO CP24: 150.0000 mg | ORAL_CAPSULE | Freq: Every day | ORAL | 0 refills | Status: DC

## 2019-08-17 NOTE — Telephone Encounter (Signed)
Spoke with patient via phone just now, insurance is not covering [place]. She does have anxiety remaining during the day, also some at night. Improved with dose increase of of venlafaxine to 75 mg, feels she could benefit from a dose increase so we will increase to 150 mg. In the mean time we will provide her with a short term supply of zolpidem 5 mg. We discussed potential side effects and the potential for dependence. She will update in a few days.

## 2019-08-23 ENCOUNTER — Ambulatory Visit: Payer: BC Managed Care – PPO | Admitting: Primary Care

## 2019-08-23 MED ORDER — ZOLPIDEM TARTRATE 5 MG PO TABS: 5.0000 mg | ORAL_TABLET | Freq: Every evening | ORAL | 0 refills | Status: DC | PRN

## 2019-09-02 DIAGNOSIS — F411 Generalized anxiety disorder: Secondary | ICD-10-CM

## 2019-09-02 DIAGNOSIS — G4700 Insomnia, unspecified: Secondary | ICD-10-CM

## 2019-09-04 MED ORDER — BUSPIRONE HCL 5 MG PO TABS: 5.0000 mg | ORAL_TABLET | Freq: Two times a day (BID) | ORAL | 0 refills | Status: DC

## 2019-09-08 ENCOUNTER — Other Ambulatory Visit: Payer: Self-pay | Admitting: Primary Care

## 2019-09-08 DIAGNOSIS — F411 Generalized anxiety disorder: Secondary | ICD-10-CM

## 2019-09-10 ENCOUNTER — Other Ambulatory Visit: Payer: Self-pay | Admitting: Primary Care

## 2019-09-10 DIAGNOSIS — F411 Generalized anxiety disorder: Secondary | ICD-10-CM

## 2019-09-10 MED ORDER — TRAZODONE HCL 100 MG PO TABS: 100.0000 mg | ORAL_TABLET | Freq: Every day | ORAL | 0 refills | Status: DC

## 2019-09-10 NOTE — Telephone Encounter (Signed)
Last prescribed on 08/17/2019 . Last appointment on 08/17/2019. No future appointment

## 2019-09-11 NOTE — Telephone Encounter (Signed)
Refill sent to pharmacy.   

## 2019-09-20 DIAGNOSIS — F411 Generalized anxiety disorder: Secondary | ICD-10-CM

## 2019-09-20 MED ORDER — BUSPIRONE HCL 10 MG PO TABS: 10.0000 mg | ORAL_TABLET | Freq: Two times a day (BID) | ORAL | 0 refills | Status: DC

## 2019-09-29 DIAGNOSIS — M7672 Peroneal tendinitis, left leg: Secondary | ICD-10-CM | POA: Diagnosis not present

## 2019-10-03 ENCOUNTER — Other Ambulatory Visit: Payer: Self-pay | Admitting: Primary Care

## 2019-10-03 DIAGNOSIS — F411 Generalized anxiety disorder: Secondary | ICD-10-CM

## 2019-10-03 DIAGNOSIS — G4700 Insomnia, unspecified: Secondary | ICD-10-CM

## 2019-10-05 NOTE — Telephone Encounter (Signed)
Ok to change? Last prescribed on 09/10/2019 . Last appointment on 08/06/2019. No future appointment

## 2019-10-05 NOTE — Telephone Encounter (Signed)
Refill sent to pharmacy.   

## 2019-10-22 ENCOUNTER — Other Ambulatory Visit: Payer: Self-pay | Admitting: Primary Care

## 2019-10-22 DIAGNOSIS — F411 Generalized anxiety disorder: Secondary | ICD-10-CM

## 2019-10-22 NOTE — Telephone Encounter (Signed)
Ok to change? Last prescribed on 09/20/2019 . Last appointment on  08/06/2019. No future appointment

## 2019-10-22 NOTE — Telephone Encounter (Signed)
Refill sent to pharmacy.   

## 2019-11-03 ENCOUNTER — Other Ambulatory Visit: Payer: Self-pay | Admitting: Primary Care

## 2019-11-03 DIAGNOSIS — I10 Essential (primary) hypertension: Secondary | ICD-10-CM

## 2019-12-22 NOTE — Telephone Encounter (Signed)
Placed in the mail as requested.

## 2019-12-22 NOTE — Telephone Encounter (Signed)
Vallarie Mare, please mail off prescription to address noted in message. Rx placed in your inbox.

## 2020-01-01 ENCOUNTER — Other Ambulatory Visit: Payer: Self-pay | Admitting: Primary Care

## 2020-01-01 DIAGNOSIS — F411 Generalized anxiety disorder: Secondary | ICD-10-CM

## 2020-01-01 DIAGNOSIS — G4700 Insomnia, unspecified: Secondary | ICD-10-CM

## 2020-03-17 ENCOUNTER — Other Ambulatory Visit: Payer: Self-pay | Admitting: Obstetrics and Gynecology

## 2020-03-17 DIAGNOSIS — Z1231 Encounter for screening mammogram for malignant neoplasm of breast: Secondary | ICD-10-CM

## 2020-04-05 ENCOUNTER — Ambulatory Visit (INDEPENDENT_AMBULATORY_CARE_PROVIDER_SITE_OTHER): Payer: BC Managed Care – PPO

## 2020-04-05 DIAGNOSIS — Z23 Encounter for immunization: Secondary | ICD-10-CM

## 2020-04-14 ENCOUNTER — Ambulatory Visit
Admission: RE | Admit: 2020-04-14 | Discharge: 2020-04-14 | Disposition: A | Discharge status: Home / Self Care | Payer: BC Managed Care – PPO | Source: Ambulatory Visit | Attending: Obstetrics and Gynecology | Admitting: Obstetrics and Gynecology

## 2020-04-14 ENCOUNTER — Other Ambulatory Visit: Payer: Self-pay

## 2020-04-14 DIAGNOSIS — Z1231 Encounter for screening mammogram for malignant neoplasm of breast: Secondary | ICD-10-CM

## 2020-04-21 ENCOUNTER — Other Ambulatory Visit: Payer: Self-pay

## 2020-04-21 DIAGNOSIS — I10 Essential (primary) hypertension: Secondary | ICD-10-CM

## 2020-04-21 MED ORDER — LISINOPRIL 20 MG PO TABS: ORAL_TABLET | ORAL | 0 refills | Status: DC

## 2020-05-26 ENCOUNTER — Other Ambulatory Visit: Payer: Self-pay | Admitting: Primary Care

## 2020-05-26 DIAGNOSIS — F411 Generalized anxiety disorder: Secondary | ICD-10-CM

## 2020-06-02 DIAGNOSIS — Z6835 Body mass index (BMI) 35.0-35.9, adult: Secondary | ICD-10-CM | POA: Diagnosis not present

## 2020-06-02 DIAGNOSIS — Z01419 Encounter for gynecological examination (general) (routine) without abnormal findings: Secondary | ICD-10-CM | POA: Diagnosis not present

## 2020-07-19 DIAGNOSIS — U071 COVID-19: Secondary | ICD-10-CM | POA: Diagnosis not present

## 2020-08-17 ENCOUNTER — Other Ambulatory Visit: Payer: Self-pay | Admitting: Primary Care

## 2020-08-17 DIAGNOSIS — I10 Essential (primary) hypertension: Secondary | ICD-10-CM

## 2020-08-17 NOTE — Telephone Encounter (Signed)
Called and lvm to schedule cpe/ follow up.

## 2020-08-17 NOTE — Telephone Encounter (Signed)
Patient due for office visit/CPE, will need for further refills.  Will provide a 30 day supply.

## 2020-08-21 NOTE — Telephone Encounter (Signed)
Called patient to schedule cpe/ follow up. LVM to call back. Letter sent.

## 2020-09-10 ENCOUNTER — Other Ambulatory Visit: Payer: Self-pay | Admitting: Primary Care

## 2020-09-10 DIAGNOSIS — I10 Essential (primary) hypertension: Secondary | ICD-10-CM

## 2020-09-11 NOTE — Telephone Encounter (Signed)
Last refill sent with note to make f/u . No appointment has been made. I have called and l/m to call for app. Can you try to get her and make appointment?

## 2020-09-12 NOTE — Telephone Encounter (Signed)
Called patient and lvm to call back and schedule follow up visit.

## 2020-09-14 NOTE — Telephone Encounter (Signed)
Called patient to schedule visit. Patient will call back this afternoon to schedule as she was at work.

## 2020-09-14 NOTE — Telephone Encounter (Signed)
Patient called back.  Patient said she can't come in to the office until May.  Patient scheduled follow up on 11/03/20. She's requesting medication be refilled until her appointment. If that's a problem, please call patient.

## 2020-09-16 NOTE — Telephone Encounter (Signed)
, °

## 2020-09-17 NOTE — Telephone Encounter (Signed)
Noted, refills provided to get her to appt. Scheduled for May 2022.

## 2020-10-01 ENCOUNTER — Other Ambulatory Visit: Payer: Self-pay | Admitting: Primary Care

## 2020-10-01 DIAGNOSIS — I10 Essential (primary) hypertension: Secondary | ICD-10-CM

## 2020-11-02 DIAGNOSIS — M722 Plantar fascial fibromatosis: Secondary | ICD-10-CM | POA: Diagnosis not present

## 2020-11-02 DIAGNOSIS — S93491A Sprain of other ligament of right ankle, initial encounter: Secondary | ICD-10-CM | POA: Diagnosis not present

## 2020-11-03 ENCOUNTER — Ambulatory Visit: Payer: BC Managed Care – PPO | Admitting: Primary Care

## 2020-11-15 DIAGNOSIS — S93491D Sprain of other ligament of right ankle, subsequent encounter: Secondary | ICD-10-CM | POA: Diagnosis not present

## 2020-11-15 DIAGNOSIS — M722 Plantar fascial fibromatosis: Secondary | ICD-10-CM | POA: Diagnosis not present

## 2020-12-01 ENCOUNTER — Ambulatory Visit: Payer: BC Managed Care – PPO | Admitting: Primary Care

## 2020-12-01 DIAGNOSIS — M722 Plantar fascial fibromatosis: Secondary | ICD-10-CM | POA: Diagnosis not present

## 2020-12-29 ENCOUNTER — Ambulatory Visit: Payer: BC Managed Care – PPO | Admitting: Primary Care

## 2021-01-30 ENCOUNTER — Ambulatory Visit: Payer: BC Managed Care – PPO | Admitting: Primary Care

## 2021-02-02 ENCOUNTER — Ambulatory Visit: Payer: BC Managed Care – PPO | Admitting: Primary Care

## 2021-04-04 ENCOUNTER — Other Ambulatory Visit: Payer: Self-pay | Admitting: Family Medicine

## 2021-04-04 DIAGNOSIS — I10 Essential (primary) hypertension: Secondary | ICD-10-CM

## 2021-04-05 NOTE — Telephone Encounter (Signed)
This patient has not been seen by me in person since 2019 and last video visit was in February 2021.  She has cancelled the last five of her office visits with me.  Can you find out what's going on?  I cannot refill her medication until she is seen IN PERSON as she needs labs and has not been seen since 2019.

## 2021-04-05 NOTE — Telephone Encounter (Signed)
Left message to return call to our office.  

## 2021-04-08 DIAGNOSIS — I10 Essential (primary) hypertension: Secondary | ICD-10-CM

## 2021-04-09 MED ORDER — LISINOPRIL 20 MG PO TABS: 20.0000 mg | ORAL_TABLET | Freq: Every day | ORAL | 0 refills | Status: DC

## 2021-04-09 NOTE — Telephone Encounter (Signed)
Called patient she has set up appointment 11-11. That is the first day that she is able to come in the office. She was not available any other time.

## 2021-04-13 ENCOUNTER — Telehealth: Payer: BC Managed Care – PPO | Admitting: Primary Care

## 2021-04-27 MED ORDER — LISINOPRIL 20 MG PO TABS: 20.0000 mg | ORAL_TABLET | Freq: Every day | ORAL | 0 refills | Status: DC

## 2021-04-27 NOTE — Addendum Note (Signed)
Addended by: [name] on: 04/27/2021 12:49 PM   Modules accepted: Orders

## 2021-05-11 ENCOUNTER — Ambulatory Visit: Payer: BC Managed Care – PPO | Admitting: Primary Care

## 2021-05-31 ENCOUNTER — Other Ambulatory Visit: Payer: Self-pay | Admitting: Primary Care

## 2021-05-31 DIAGNOSIS — I10 Essential (primary) hypertension: Secondary | ICD-10-CM

## 2021-06-01 ENCOUNTER — Encounter: Payer: Self-pay | Admitting: Primary Care

## 2021-06-01 ENCOUNTER — Ambulatory Visit: Payer: BC Managed Care – PPO | Admitting: Primary Care

## 2021-06-01 ENCOUNTER — Other Ambulatory Visit: Payer: Self-pay

## 2021-06-01 VITALS — BP 132/84 | HR 69 | Temp 98.3°F | Wt 216.6 lb

## 2021-06-01 DIAGNOSIS — E785 Hyperlipidemia, unspecified: Secondary | ICD-10-CM

## 2021-06-01 DIAGNOSIS — G4700 Insomnia, unspecified: Secondary | ICD-10-CM

## 2021-06-01 DIAGNOSIS — Z0001 Encounter for general adult medical examination with abnormal findings: Secondary | ICD-10-CM | POA: Diagnosis not present

## 2021-06-01 DIAGNOSIS — I10 Essential (primary) hypertension: Secondary | ICD-10-CM

## 2021-06-01 DIAGNOSIS — Z23 Encounter for immunization: Secondary | ICD-10-CM | POA: Diagnosis not present

## 2021-06-01 DIAGNOSIS — F411 Generalized anxiety disorder: Secondary | ICD-10-CM | POA: Diagnosis not present

## 2021-06-01 LAB — COMPREHENSIVE METABOLIC PANEL
ALT: 18 U/L (ref 0–35)
AST: 16 U/L (ref 0–37)
Albumin: 4.2 g/dL (ref 3.5–5.2)
Alkaline Phosphatase: 69 U/L (ref 39–117)
BUN: 13 mg/dL (ref 6–23)
CO2: 28 mEq/L (ref 19–32)
Calcium: 9.4 mg/dL (ref 8.4–10.5)
Chloride: 100 mEq/L (ref 96–112)
Creatinine, Ser: 0.86 mg/dL (ref 0.40–1.20)
GFR: 83.28 mL/min (ref >60.00)
Glucose, Bld: 92 mg/dL (ref 70–99)
Potassium: 4.1 mEq/L (ref 3.5–5.1)
Sodium: 135 mEq/L (ref 135–145)
Total Bilirubin: 0.5 mg/dL (ref 0.2–1.2)
Total Protein: 7.6 g/dL (ref 6.0–8.3)

## 2021-06-01 LAB — LIPID PANEL
Cholesterol: 239 mg/dL — ABNORMAL HIGH (ref 0–200)
HDL: 42.6 mg/dL (ref >39.00)
NonHDL: 196.7
Total CHOL/HDL Ratio: 6
Triglycerides: 222 mg/dL — ABNORMAL HIGH (ref 0.0–149.0)
VLDL: 44.4 mg/dL — ABNORMAL HIGH (ref 0.0–40.0)

## 2021-06-01 LAB — CBC
HCT: 39.1 % (ref 36.0–46.0)
Hemoglobin: 13.1 g/dL (ref 12.0–15.0)
MCHC: 33.6 g/dL (ref 30.0–36.0)
MCV: 90.2 fl (ref 78.0–100.0)
Platelets: 368 10*3/uL (ref 150.0–400.0)
RBC: 4.33 Mil/uL (ref 3.87–5.11)
RDW: 14.2 % (ref 11.5–15.5)
WBC: 9 10*3/uL (ref 4.0–10.5)

## 2021-06-01 LAB — LDL CHOLESTEROL, DIRECT: Direct LDL: 175 mg/dL

## 2021-06-01 LAB — HEMOGLOBIN A1C: Hgb A1c MFr Bld: 5.8 % (ref 4.6–6.5)

## 2021-06-01 MED ORDER — SERTRALINE HCL 50 MG PO TABS: 50.0000 mg | ORAL_TABLET | Freq: Every day | ORAL | 0 refills | Status: DC

## 2021-06-01 NOTE — Progress Notes (Signed)
Subjective:    Patient ID: Marie Barnett, female    DOB: [birth year], 40 y.o.   MRN: [id number]  HPI  Marie Barnett is a very pleasant 40 y.o. female with a history of hypertension, psoriasis, hyperlipidemia, GAD, insomnia who presents today for complete physical and follow up of chronic conditions. She would like to discuss anxiety.   Chronic anxiety for years. Has failed treatment on Effexor, Buspar, Lexapro, Trazodone previously. Currently managed on Trintellix 5 mg which was initiated by her GYN one year ago. Overall doesn't feel that it is effective.   Symptoms include feeling anxious, worries, difficulty sleeping, OCD tendencies. She is interesting in trying a new treatment.   Immunizations: -Tetanus: Unsure -Influenza: Completed this season  -Covid-19: 3 vaccines  Diet: Fair diet.  Exercise: Walking some   Eye exam: Completes annually, due and will schedule   Dental exam: Completes annually   Pap Smear: Due, follows with GYN Mammogram: Completed in October 2021, she will schedule  BP Readings from Last 3 Encounters:  06/01/21 (!) 146/94  05/19/19 123/79  05/04/18 116/72       Review of Systems  Constitutional:  Negative for unexpected weight change.  HENT:  Negative for rhinorrhea.   Eyes:  Negative for visual disturbance.  Respiratory:  Negative for cough and shortness of breath.   Cardiovascular:  Negative for chest pain.  Gastrointestinal:  Negative for constipation and diarrhea.  Genitourinary:  Negative for difficulty urinating.  Musculoskeletal:  Positive for arthralgias.  Skin:  Negative for rash.  Allergic/Immunologic: Negative for environmental allergies.  Neurological:  Negative for dizziness, numbness and headaches.  Psychiatric/Behavioral:  The patient is nervous/anxious.         Past Medical History:  Diagnosis Date   Fibroids    Uterine   Frequent headaches    Hypertension    Kidney stone     Social History   Socioeconomic History    Marital status: Married    Spouse name: Not on file   Number of children: Not on file   Years of education: Not on file   Highest education level: Not on file  Occupational History   Not on file  Tobacco Use   Smoking status: Never   Smokeless tobacco: Never  Substance and Sexual Activity   Alcohol use: Yes    Alcohol/week: 0.0 standard drinks    Comment: social   Drug use: Not on file   Sexual activity: Not on file  Other Topics Concern   Not on file  Social History Narrative   Married.   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   Enjoys spending time at [employer] and pool.    Social Determinants of Health   Financial Resource Strain: Not on file  Food Insecurity: Not on file  Transportation Needs: Not on file  Physical Activity: Not on file  Stress: Not on file  Social Connections: Not on file  Intimate Partner Violence: Not on file    Past Surgical History:  Procedure Laterality Date   CESAREAN SECTION  2014    Family History  Problem Relation Age of Onset   Hyperlipidemia Mother    Hypertension Mother    Cystic kidney disease Mother    Hyperlipidemia Father    Hypertension Father    Alcohol abuse Brother    Alcohol abuse Maternal Uncle    Hyperlipidemia Maternal Grandmother    Hypertension Maternal Grandmother    Stroke Maternal Grandmother    Depression  Maternal Grandmother    Prostate cancer Maternal Grandfather    Stroke Maternal Grandfather    Colon cancer Paternal Grandmother    Bipolar disorder Neg Hx     Allergies  Allergen Reactions   Amoxicillin Hives   Amlodipine Swelling    Current Outpatient Medications on File Prior to Visit  Medication Sig Dispense Refill   levonorgestrel (MIRENA) 20 MCG/24HR IUD 1 each by Intrauterine route once.     lisinopril (ZESTRIL) 20 MG tablet Take 1 tablet (20 mg total) by mouth daily. For blood pressure. Final refill. Must be seen for further refills. 30 tablet 0   Multiple Vitamin (MULTIVITAMIN)  capsule Take 1 capsule by mouth daily.     VITAMIN D, ERGOCALCIFEROL, PO Take by mouth. Taking 2 gummy daily.     vortioxetine HBr (TRINTELLIX) 5 MG TABS tablet Take 1 tablet by mouth daily.     No current facility-administered medications on file prior to visit.    BP (!) 146/94 (BP Location: Left Arm, Patient Position: Sitting, Cuff Size: Large)   Pulse 69   Temp 98.3 F (36.8 C) (Temporal)   Wt 216 lb 9 oz (98.2 kg)   SpO2 99%   BMI 36.88 kg/m  Objective:   Physical Exam HENT:     Right Ear: Tympanic membrane and ear canal normal.     Left Ear: Tympanic membrane and ear canal normal.     Nose: Nose normal.  Eyes:     Conjunctiva/sclera: Conjunctivae normal.     Pupils: Pupils are equal, round, and reactive to light.  Neck:     Thyroid: No thyromegaly.  Cardiovascular:     Rate and Rhythm: Normal rate and regular rhythm.     Heart sounds: No murmur heard. Pulmonary:     Effort: Pulmonary effort is normal.     Breath sounds: Normal breath sounds. No rales.  Abdominal:     General: Bowel sounds are normal.     Palpations: Abdomen is soft.     Tenderness: There is no abdominal tenderness.  Musculoskeletal:        General: Normal range of motion.     Cervical back: Neck supple.  Lymphadenopathy:     Cervical: No cervical adenopathy.  Skin:    General: Skin is warm and dry.     Findings: No rash.  Neurological:     Mental Status: She is alert and oriented to person, place, and time.     Cranial Nerves: No cranial nerve deficit.     Deep Tendon Reflexes: Reflexes are normal and symmetric.  Psychiatric:        Mood and Affect: Mood normal.          Assessment & Plan:      This visit occurred during the SARS-CoV-2 public health emergency.  Safety protocols were in place, including screening questions prior to the visit, additional usage of staff PPE, and extensive cleaning of exam room while observing appropriate contact time as indicated for disinfecting  solutions.

## 2021-06-01 NOTE — Addendum Note (Signed)
Addended by: Tammi Sou on: 06/01/2021 12:25 PM   Modules accepted: Orders

## 2021-06-01 NOTE — Assessment & Plan Note (Signed)
Above goal initially, improved with recheck. Home readings are better.  Continue lisinopril 20 mg. CMP pending.

## 2021-06-01 NOTE — Assessment & Plan Note (Signed)
Discussed the importance of a healthy diet and regular exercise in order for weight loss, and to reduce the risk of further co-morbidity.  Repeat lipid panel pending. 

## 2021-06-01 NOTE — Patient Instructions (Signed)
Stop by the lab prior to leaving today. I will notify you of your results once received.   Stop Trintillex 5 mg for anxiety.  Start sertraline (Zoloft) 50 mg daily for anxiety. Start with 1/2 tablet x 1 week, then increase to 2 tablets thereafter.   Please update me via MyChart in 4-6 weeks regarding anxiety.  It was a pleasure to see you today!  Preventive Care 42-42 Years Old, Female Preventive care refers to lifestyle choices and visits with your health care provider that can promote health and wellness. Preventive care visits are also called wellness exams. What can I expect for my preventive care visit? Counseling Your health care provider may ask you questions about your: Medical history, including: Past medical problems. Family medical history. Pregnancy history. Current health, including: Menstrual cycle. Method of birth control. Emotional well-being. Home life and relationship well-being. Sexual activity and sexual health. Lifestyle, including: Alcohol, nicotine or tobacco, and drug use. Access to firearms. Diet, exercise, and sleep habits. Work and work Statistician. Sunscreen use. Safety issues such as seatbelt and bike helmet use. Physical exam Your health care provider will check your: Height and weight. These may be used to calculate your BMI (body mass index). BMI is a measurement that tells if you are at a healthy weight. Waist circumference. This measures the distance around your waistline. This measurement also tells if you are at a healthy weight and may help predict your risk of certain diseases, such as type 2 diabetes and high blood pressure. Heart rate and blood pressure. Body temperature. Skin for abnormal spots. What immunizations do I need? Vaccines are usually given at various ages, according to a schedule. Your health care provider will recommend vaccines for you based on your age, medical history, and lifestyle or other factors, such as travel or  where you work. What tests do I need? Screening Your health care provider may recommend screening tests for certain conditions. This may include: Lipid and cholesterol levels. Diabetes screening. This is done by checking your blood sugar (glucose) after you have not eaten for a while (fasting). Pelvic exam and Pap test. Hepatitis B test. Hepatitis C test. HIV (human immunodeficiency virus) test. STI (sexually transmitted infection) testing, if you are at risk. Lung cancer screening. Colorectal cancer screening. Mammogram. Talk with your health care provider about when you should start having regular mammograms. This may depend on whether you have a family history of breast cancer. BRCA-related cancer screening. This may be done if you have a family history of breast, ovarian, tubal, or peritoneal cancers. Bone density scan. This is done to screen for osteoporosis. Talk with your health care provider about your test results, treatment options, and if necessary, the need for more tests. Follow these instructions at home: Eating and drinking  Eat a diet that includes fresh fruits and vegetables, whole grains, lean protein, and low-fat dairy products. Take vitamin and mineral supplements as recommended by your health care provider. Do not drink alcohol if: Your health care provider tells you not to drink. You are pregnant, may be pregnant, or are planning to become pregnant. If you drink alcohol: Limit how much you have to 0-1 drink a day. Know how much alcohol is in your drink. In the [place] one drink equals one 12 oz bottle of beer (355 mL), one 5 oz glass of wine (148 mL), or one 1 oz glass of hard liquor (44 mL). Lifestyle Brush your teeth every morning and night with fluoride toothpaste. Floss one  time each day. Exercise for at least 30 minutes 5 or more days each week. Do not use any products that contain nicotine or tobacco. These products include cigarettes, chewing tobacco, and  vaping devices, such as e-cigarettes. If you need help quitting, ask your health care provider. Do not use drugs. If you are sexually active, practice safe sex. Use a condom or other form of protection to prevent STIs. If you do not wish to become pregnant, use a form of birth control. If you plan to become pregnant, see your health care provider for a prepregnancy visit. Take aspirin only as told by your health care provider. Make sure that you understand how much to take and what form to take. Work with your health care provider to find out whether it is safe and beneficial for you to take aspirin daily. Find healthy ways to manage stress, such as: Meditation, yoga, or listening to music. Journaling. Talking to a trusted person. Spending time with friends and family. Minimize exposure to UV radiation to reduce your risk of skin cancer. Safety Always wear your seat belt while driving or riding in a vehicle. Do not drive: If you have been drinking alcohol. Do not ride with someone who has been drinking. When you are tired or distracted. While texting. If you have been using any mind-altering substances or drugs. Wear a helmet and other protective equipment during sports activities. If you have firearms in your house, make sure you follow all gun safety procedures. Seek help if you have been physically or sexually abused. What's next? Visit your health care provider once a year for an annual wellness visit. Ask your health care provider how often you should have your eyes and teeth checked. Stay up to date on all vaccines. This information is not intended to replace advice given to you by your health care provider. Make sure you discuss any questions you have with your health care provider. Document Revised: 12/13/2020 Document Reviewed: 12/13/2020 Elsevier Patient Education  [place].   Tdap (Tetanus, Diphtheria, Pertussis) Vaccine: What You Need to Know 1. Why get  vaccinated? Tdap vaccine can prevent tetanus, diphtheria, and pertussis. Diphtheria and pertussis spread from person to person. Tetanus enters the body through cuts or wounds. TETANUS (T) causes painful stiffening of the muscles. Tetanus can lead to serious health problems, including being unable to open the mouth, having trouble swallowing and breathing, or death. DIPHTHERIA (D) can lead to difficulty breathing, heart failure, paralysis, or death. PERTUSSIS (aP), also known as "whooping cough," can cause uncontrollable, violent coughing that makes it hard to breathe, eat, or drink. Pertussis can be extremely serious especially in babies and young children, causing pneumonia, convulsions, brain damage, or death. In teens and adults, it can cause weight loss, loss of bladder control, passing out, and rib fractures from severe coughing. 2. Tdap vaccine Tdap is only for children 7 years and older, adolescents, and adults.  Adolescents should receive a single dose of Tdap, preferably at age 73 or 64 years. Pregnant people should get a dose of Tdap during every pregnancy, preferably during the early part of the third trimester, to help protect the newborn from pertussis. Infants are most at risk for severe, life-threatening complications from pertussis. Adults who have never received Tdap should get a dose of Tdap. Also, adults should receive a booster dose of either Tdap or Td (a different vaccine that protects against tetanus and diphtheria but not pertussis) every 10 years, or after 5 years  in the case of a severe or dirty wound or burn. Tdap may be given at the same time as other vaccines. 3. Talk with your health care provider Tell your vaccine provider if the person getting the vaccine: Has had an allergic reaction after a previous dose of any vaccine that protects against tetanus, diphtheria, or pertussis, or has any severe, life-threatening allergies Has had a coma, decreased level of  consciousness, or prolonged seizures within 7 days after a previous dose of any pertussis vaccine (DTP, DTaP, or Tdap) Has seizures or another nervous system problem Has ever had Guillain-Barr Syndrome (also called "GBS") Has had severe pain or swelling after a previous dose of any vaccine that protects against tetanus or diphtheria In some cases, your health care provider may decide to postpone Tdap vaccination until a future visit. People with minor illnesses, such as a cold, may be vaccinated. People who are moderately or severely ill should usually wait until they recover before getting Tdap vaccine.  Your health care provider can give you more information. 4. Risks of a vaccine reaction Pain, redness, or swelling where the shot was given, mild fever, headache, feeling tired, and nausea, vomiting, diarrhea, or stomachache sometimes happen after Tdap vaccination. People sometimes faint after medical procedures, including vaccination. Tell your provider if you feel dizzy or have vision changes or ringing in the ears.  As with any medicine, there is a very remote chance of a vaccine causing a severe allergic reaction, other serious injury, or death. 5. What if there is a serious problem? An allergic reaction could occur after the vaccinated person leaves the clinic. If you see signs of a severe allergic reaction (hives, swelling of the face and throat, difficulty breathing, a fast heartbeat, dizziness, or weakness), call 9-1-1 and get the person to the nearest hospital. For other signs that concern you, call your health care provider.  Adverse reactions should be reported to the Vaccine Adverse Event Reporting System (VAERS). Your health care provider will usually file this report, or you can do it yourself. Visit the VAERS website at www.vaers.SamedayNews.es or call (914) 519-1395. VAERS is only for reporting reactions, and VAERS staff members do not give medical advice. 6. The National Vaccine Injury  Compensation Program The [employer] Vaccine Injury Compensation Program (VICP) is a federal program that was created to compensate people who may have been injured by certain vaccines. Claims regarding alleged injury or death due to vaccination have a time limit for filing, which may be as short as two years. Visit the VICP website at GoldCloset.com.ee or call (401)322-9992 to learn about the program and about filing a claim. 7. How can I learn more? Ask your health care provider. Call your local or state health department. Visit the website of the Food and Drug Administration (FDA) for vaccine package inserts and additional information at TraderRating.uy. Contact the Centers for Disease Control and Prevention (CDC): Call 2262039511 (1-800-CDC-INFO) or Visit CDC's website at http://hunter.com/. Vaccine Information Statement Tdap (Tetanus, Diphtheria, Pertussis) Vaccine (02/04/2020) This information is not intended to replace advice given to you by your health care provider. Make sure you discuss any questions you have with your health care provider. Document Revised: 03/01/2020 Document Reviewed: 03/01/2020 Elsevier Patient Education  2022 [employer].

## 2021-06-01 NOTE — Assessment & Plan Note (Signed)
Managed now on Trintellix 5 mg, ineffective. Failed numerous other treatments.  Trial of sertraline 50 mg sent to pharmacy. Discussed instructions for use.  She will update in 4-6 weeks.

## 2021-06-01 NOTE — Assessment & Plan Note (Signed)
Chronic and continued, failed numerous treatments in the past.  Stop Trintellix, start Zoloft 50 mg.  She will update.

## 2021-06-01 NOTE — Assessment & Plan Note (Addendum)
Tetanus due, provided today. Other vaccines UTD.  Pap smear due, follows with GYN and has appointment scheduled.  Mammogram due, she will schedule.  Discussed the importance of a healthy diet and regular exercise in order for weight loss, and to reduce the risk of further co-morbidity.  Exam today stable. Labs pending.

## 2021-06-03 DIAGNOSIS — G4700 Insomnia, unspecified: Secondary | ICD-10-CM

## 2021-06-03 DIAGNOSIS — E785 Hyperlipidemia, unspecified: Secondary | ICD-10-CM

## 2021-06-03 DIAGNOSIS — F411 Generalized anxiety disorder: Secondary | ICD-10-CM

## 2021-06-03 DIAGNOSIS — R7303 Prediabetes: Secondary | ICD-10-CM

## 2021-07-04 ENCOUNTER — Other Ambulatory Visit: Payer: Self-pay | Admitting: Obstetrics and Gynecology

## 2021-07-04 DIAGNOSIS — Z1231 Encounter for screening mammogram for malignant neoplasm of breast: Secondary | ICD-10-CM

## 2021-07-15 ENCOUNTER — Other Ambulatory Visit: Payer: Self-pay | Admitting: Primary Care

## 2021-07-15 DIAGNOSIS — F411 Generalized anxiety disorder: Secondary | ICD-10-CM

## 2021-07-23 DIAGNOSIS — Z1231 Encounter for screening mammogram for malignant neoplasm of breast: Secondary | ICD-10-CM

## 2021-08-17 ENCOUNTER — Encounter: Payer: Self-pay | Admitting: Primary Care

## 2021-08-17 ENCOUNTER — Other Ambulatory Visit: Payer: Self-pay

## 2021-08-17 ENCOUNTER — Telehealth (INDEPENDENT_AMBULATORY_CARE_PROVIDER_SITE_OTHER): Payer: BC Managed Care – PPO | Admitting: Primary Care

## 2021-08-17 VITALS — Ht 64.25 in | Wt 216.0 lb

## 2021-08-17 DIAGNOSIS — G4700 Insomnia, unspecified: Secondary | ICD-10-CM

## 2021-08-17 DIAGNOSIS — F411 Generalized anxiety disorder: Secondary | ICD-10-CM | POA: Diagnosis not present

## 2021-08-17 MED ORDER — TRAZODONE HCL 50 MG PO TABS: 50.0000 mg | ORAL_TABLET | Freq: Every day | ORAL | 0 refills | Status: DC

## 2021-08-17 NOTE — Progress Notes (Signed)
Patient ID: Marie Barnett, female    DOB: 04-11-1981, 41 y.o.   MRN: [id number]  Virtual visit completed through [place], a video enabled telemedicine application. Due to national recommendations of social distancing due to COVID-19, a virtual visit is felt to be most appropriate for this patient at this time. Reviewed limitations, risks, security and privacy concerns of performing a virtual visit and the availability of in person appointments. I also reviewed that there may be a patient responsible charge related to this service. The patient agreed to proceed.   Patient location: home Provider location: [place] at [facility], office Persons participating in this virtual visit: patient, provider   If any vitals were documented, they were collected by patient at home unless specified below.    Ht 5' 4.25" (1.632 m)    Wt 216 lb (98 kg)    BMI 36.79 kg/m    CC: insomnia, anxiety, OCD symptoms Subjective:   HPI: Marie Barnett is a 41 y.o. female with a history of insomnia, anxiety, obsessive thoughts and actions presenting on 08/17/2021 for Anxiety (Has been started on Zoloft does not think it is working at all. )  She was last evaluated in December 2022 for symptoms of feeling anxious, worrying, difficulty sleeping, OCD tendencies.  She had previously failed treatment on Effexor, BuSpar, Lexapro, trazodone alone.  At the time she was managed on Trintellix but did not feel this was effective either.    She is currently managed on sertraline 100 mg which was increased in January 2023 for ongoing symptoms.  Current symptoms include difficulty falling and staying sleep. She will wake up nearly every night and will be awake for hours. She has mind racing thoughts when getting into bed, thinking about the next day. She's been told by her husband that she snores occasionally  She believes she has OCD with symptoms of brushing her hair so many times, turning off the light so many times, takes a  long time before getting to bed due to her routine, has to do certain things in order to feel good about her day.  She has never been formally diagnosed with OCD.  Currently, she has noticed improvement in anxiety overall, but is mostly bothered by her OCD symptoms and insomnia which have persisted.  She is questioning whether the trazodone in combination with her Zoloft will help with sleep.  She does believe that her OCD symptoms and overall lifestyle will improve if she is able to sleep well.  She has never tried therapy, is open to doing so.     Relevant past medical, surgical, family and social history reviewed and updated as indicated. Interim medical history since our last visit reviewed. Allergies and medications reviewed and updated. Outpatient Medications Prior to Visit  Medication Sig Dispense Refill   levonorgestrel (MIRENA) 20 MCG/24HR IUD 1 each by Intrauterine route once.     lisinopril (ZESTRIL) 20 MG tablet Take 1 tablet (20 mg total) by mouth daily. For blood pressure. 90 tablet 3   Multiple Vitamin (MULTIVITAMIN) capsule Take 1 capsule by mouth daily.     sertraline (ZOLOFT) 100 MG tablet TAKE 1 TABLET BY MOUTH DAILY FOR ANXIETY 90 tablet 0   VITAMIN D, ERGOCALCIFEROL, PO Take by mouth. Taking 2 gummy daily.     No facility-administered medications prior to visit.     Per HPI unless specifically indicated in ROS section below Review of Systems Objective:  Ht 5' 4.25" (1.632 m)    Wt 216  lb (98 kg)    BMI 36.79 kg/m   Wt Readings from Last 3 Encounters:  08/17/21 216 lb (98 kg)  06/01/21 216 lb 9 oz (98.2 kg)  05/04/18 182 lb 8 oz (82.8 kg)       Physical exam: General: Alert and oriented x 3, no distress, does not appear sickly  Pulmonary: Speaks in complete sentences without increased work of breathing, no cough during visit.  Psychiatric: Normal mood, thought content, and behavior.     Results for orders placed or performed in visit on 06/01/21  Lipid  panel  Result Value Ref Range   Cholesterol 239 (H) 0 - 200 mg/dL   Triglycerides 222.0 (H) 0.0 - 149.0 mg/dL   HDL 42.60 >39.00 mg/dL   VLDL 44.4 (H) 0.0 - 40.0 mg/dL   Total CHOL/HDL Ratio 6    NonHDL 196.70   Hemoglobin A1c  Result Value Ref Range   Hgb A1c MFr Bld 5.8 4.6 - 6.5 %  Comprehensive metabolic panel  Result Value Ref Range   Sodium 135 135 - 145 mEq/L   Potassium 4.1 3.5 - 5.1 mEq/L   Chloride 100 96 - 112 mEq/L   CO2 28 19 - 32 mEq/L   Glucose, Bld 92 70 - 99 mg/dL   BUN 13 6 - 23 mg/dL   Creatinine, Ser 0.86 0.40 - 1.20 mg/dL   Total Bilirubin 0.5 0.2 - 1.2 mg/dL   Alkaline Phosphatase 69 39 - 117 U/L   AST 16 0 - 37 U/L   ALT 18 0 - 35 U/L   Total Protein 7.6 6.0 - 8.3 g/dL   Albumin 4.2 3.5 - 5.2 g/dL   GFR 83.28 >60.00 mL/min   Calcium 9.4 8.4 - 10.5 mg/dL  CBC  Result Value Ref Range   WBC 9.0 4.0 - 10.5 K/uL   RBC 4.33 3.87 - 5.11 Mil/uL   Platelets 368.0 150.0 - 400.0 K/uL   Hemoglobin 13.1 12.0 - 15.0 g/dL   HCT 39.1 36.0 - 46.0 %   MCV 90.2 78.0 - 100.0 fl   MCHC 33.6 30.0 - 36.0 g/dL   RDW 14.2 11.5 - 15.5 %  LDL cholesterol, direct  Result Value Ref Range   Direct LDL 175.0 mg/dL   Assessment & Plan:   Problem List Items Addressed This Visit       Other   GAD (generalized anxiety disorder)    Seems overall improved, but continues with OCD type symptoms and insomnia.  Continue sertraline 100 mg, consider dose increase to 125 after a few weeks when she updates.  Referral placed to therapy.  Will initiate trazodone 50 to 100 mg at bedtime.      Relevant Medications   traZODone (DESYREL) 50 MG tablet   Other Relevant Orders   Ambulatory referral to Psychology   Insomnia - Primary    Uncontrolled.  Seems to be secondary to anxiety/OCD, but will keep sleep apnea on the differentials list.  Add trazodone 50 mg at bedtime, discussed that she can titrate up to 100 mg after a week or so if needed.  Continue Zoloft 100 mg  daily.  She will update a few weeks.      Relevant Medications   traZODone (DESYREL) 50 MG tablet   Other Relevant Orders   Ambulatory referral to Psychology     Meds ordered this encounter  Medications   traZODone (DESYREL) 50 MG tablet    Sig: Take 1-2 tablets (50-100 mg total) by mouth  at bedtime. For sleep    Dispense:  180 tablet    Refill:  0    Order Specific Question:   Supervising Provider    Answer:   Diona Browner, AMY E [[id number]]   Orders Placed This Encounter  Procedures   Ambulatory referral to Psychology    Referral Priority:   Routine    Referral Type:   Psychiatric    Referral Reason:   Specialty Services Required    Requested Specialty:   Psychology    Number of Visits Requested:   1    I discussed the assessment and treatment plan with the patient. The patient was provided an opportunity to ask questions and all were answered. The patient agreed with the plan and demonstrated an understanding of the instructions. The patient was advised to call back or seek an in-person evaluation if the symptoms worsen or if the condition fails to improve as anticipated.  Follow up plan:  Continue sertraline 100 mg daily for anxiety and OCD symptoms.  Start trazodone 50 mg tablets for sleep.  Take 1 to 2 tablets by mouth at bedtime.  Update me in a few weeks.  You will be contacted regarding your referral to therapy.  Please let us know if you have not been contacted within two weeks.    [name], NP

## 2021-08-17 NOTE — Assessment & Plan Note (Signed)
Uncontrolled.  Seems to be secondary to anxiety/OCD, but will keep sleep apnea on the differentials list.  Add trazodone 50 mg at bedtime, discussed that she can titrate up to 100 mg after a week or so if needed.  Continue Zoloft 100 mg daily.  She will update a few weeks.

## 2021-08-17 NOTE — Assessment & Plan Note (Signed)
Seems overall improved, but continues with OCD type symptoms and insomnia.  Continue sertraline 100 mg, consider dose increase to 125 after a few weeks when she updates.  Referral placed to therapy.  Will initiate trazodone 50 to 100 mg at bedtime.

## 2021-08-22 MED ORDER — FLUOXETINE HCL 40 MG PO CAPS: 40.0000 mg | ORAL_CAPSULE | Freq: Every day | ORAL | 0 refills | Status: DC

## 2021-09-18 MED ORDER — FLUOXETINE HCL 20 MG PO CAPS: 20.0000 mg | ORAL_CAPSULE | Freq: Every day | ORAL | 0 refills | Status: DC

## 2021-09-24 DIAGNOSIS — Z124 Encounter for screening for malignant neoplasm of cervix: Secondary | ICD-10-CM | POA: Diagnosis not present

## 2021-09-24 DIAGNOSIS — Z1231 Encounter for screening mammogram for malignant neoplasm of breast: Secondary | ICD-10-CM | POA: Diagnosis not present

## 2021-09-24 DIAGNOSIS — Z1151 Encounter for screening for human papillomavirus (HPV): Secondary | ICD-10-CM | POA: Diagnosis not present

## 2021-09-24 DIAGNOSIS — Z01419 Encounter for gynecological examination (general) (routine) without abnormal findings: Secondary | ICD-10-CM | POA: Diagnosis not present

## 2021-09-24 DIAGNOSIS — Z6836 Body mass index (BMI) 36.0-36.9, adult: Secondary | ICD-10-CM | POA: Diagnosis not present

## 2021-09-25 LAB — HM PAP SMEAR: HPV, high-risk: NEGATIVE

## 2021-11-09 ENCOUNTER — Encounter (INDEPENDENT_AMBULATORY_CARE_PROVIDER_SITE_OTHER): Payer: BC Managed Care – PPO

## 2021-11-09 DIAGNOSIS — G4700 Insomnia, unspecified: Secondary | ICD-10-CM

## 2021-11-09 DIAGNOSIS — F411 Generalized anxiety disorder: Secondary | ICD-10-CM | POA: Diagnosis not present

## 2021-11-09 MED ORDER — BUSPIRONE HCL 5 MG PO TABS: 5.0000 mg | ORAL_TABLET | Freq: Two times a day (BID) | ORAL | 0 refills | Status: DC

## 2021-11-09 MED ORDER — FLUOXETINE HCL 20 MG PO CAPS: 20.0000 mg | ORAL_CAPSULE | Freq: Every day | ORAL | 0 refills | Status: DC

## 2021-11-09 MED ORDER — FLUOXETINE HCL 40 MG PO CAPS: 40.0000 mg | ORAL_CAPSULE | Freq: Every day | ORAL | 0 refills | Status: DC

## 2021-11-19 ENCOUNTER — Ambulatory Visit: Payer: BC Managed Care – PPO | Admitting: Psychology

## 2021-11-27 ENCOUNTER — Ambulatory Visit (INDEPENDENT_AMBULATORY_CARE_PROVIDER_SITE_OTHER): Payer: BC Managed Care – PPO | Admitting: Psychology

## 2021-11-27 DIAGNOSIS — F411 Generalized anxiety disorder: Secondary | ICD-10-CM

## 2021-11-27 NOTE — Progress Notes (Addendum)
[facility] Counselor Initial Adult Exam  Name: Marie Barnett Date: 11/27/2021 MRN: [id number] DOB: [birth year] PCP: [name], NP  Time spent: 4:00pm-4:55pm    55 minutes  Guardian/Payee:  Crista Curb requested: No   Reason for Visit /Presenting Problem: Pt present for face-to-face initial assessment via video Webex.  Pt consents to telehealth video session due to COVID 19 pandemic. Location of pt: home Location of therapist: home office.  Pt has been dealing with anxiety for a few years.  She also thinks she has some OCD and insomnia.   Pt has a hard time falling asleep and staying asleep.  Pt has racing thoughts that keep her awake.   Pt has been prescribed Prozac 60 mg and Buspar by PCP.   Pt states she feels anxious a lot of the time.   Pt's anxiety heightened since the pandemic and has stayed fairly constant.   Pt states she has OCD tendencies.  Pt engages in checking behaviors and repetitive behaviors.   Pt has a lot of patterns that she "has to adhere to".  Pt does a lot of counting in her head.  If pt does not engage in the OCD behaviors she feels anxious and worries that "something bad will happen".  Pt feels like the OCD behaviors disrupt her life and family life.   Pt has a lot of "what if" thoughts.   Pt has tried Trazadone , ambien, benadryl, melatonin for sleep and nothing helped.     Mental Status Exam: Appearance:   Casual     Behavior:  Appropriate  Motor:  Normal  Speech/Language:   Normal Rate  Affect:  Appropriate  Mood:  normal  Thought process:  normal  Thought content:    WNL  Sensory/Perceptual disturbances:    WNL  Orientation:  oriented to person, place, time/date, and situation  Attention:  Good  Concentration:  Good  Memory:  WNL  Fund of knowledge:   Good  Insight:    Good  Judgment:   Good  Impulse Control:  Good     Reported Symptoms:  anxiety, sleep disturbance.   Risk Assessment: Danger to Self:   No Self-injurious Behavior: No Danger to Others: No Duty to Warn:no Physical Aggression / Violence:No  Access to Firearms a concern: No  Gang Involvement:No  Patient / guardian was educated about steps to take if suicide or homicide risk level increases between visits: n/a While future psychiatric events cannot be accurately predicted, the patient does not currently require acute inpatient psychiatric care and does not currently meet [facility] involuntary commitment criteria.  Substance Abuse History: Current substance abuse: No     Past Psychiatric History:   No previous psychological problems have been observed Outpatient Providers:this is pt's first time in therapy. History of Psych Hospitalization: No  Psychological Testing:  n/a    Abuse History:  Victim of: No.,  n/a    Report needed: No. Victim of Neglect:No. Perpetrator of  n/a   Witness / Exposure to Domestic Violence: No   Protective Services Involvement: No  Witness to [employer] Violence:  No   Family History:  Family History  Problem Relation Age of Onset   Hyperlipidemia Mother    Hypertension Mother    Cystic kidney disease Mother    Hyperlipidemia Father    Hypertension Father    Alcohol abuse Brother    Alcohol abuse Maternal Uncle    Hyperlipidemia Maternal Grandmother    Hypertension Maternal  Grandmother    Stroke Maternal Grandmother    Depression Maternal Grandmother    Prostate cancer Maternal Grandfather    Stroke Maternal Grandfather    Colon cancer Paternal Grandmother    Bipolar disorder Neg Hx     Living situation: the patient lives with their family.  Pt lives with her husband and 3 daughters ages 25,  74, and 21.   They have a cat and a dog and a [place] pig and a fish.   Pt 's parents are very involved in their lives.   Pt is very involved in her daughters' schools.    Pt grew up with both parents and a younger brother.   Pt's brother has struggled with alcohol and drug addiction.   Pt  lives on the same street as her parents.   Family history of mental health issues:  pt has a cousin who is bipolar, schizophrenic.   Pt's mother and grandmother have had depression and anxiety.   There are several family members with alcohol and drug issues.   No childhood abuse.    Sexual Orientation: Straight  Relationship Status: married since 2002. Name of spouse / other:Corey.   If a parent, number of children / ages:Pt has 3 daughters ages 51,12, and 45. Pt states her husband is very supportive.   Support Systems: spouse friends parents  Financial Stress:  No   Income/Employment/Disability: Employment Pt works at [employer].    Pt's husband works as a Dance movement psychotherapist.    Military Service: No   Educational History: Education: Scientist, product/process development: Protestant  Any cultural differences that may affect / interfere with treatment:  not applicable   Recreation/Hobbies: pt likes to swim and likes to go to [employer].   Pt likes to go to the lake.  Pt likes to read.  Pt enjoys watching her girls play sports.    Stressors: Other: Pt states the main stressor in her life is her OCD and repetitive behavior.   Pt worries at times about money bc of future expenses of weddings and colleges.   Pt states she is a germaphobe.     Strengths: Supportive Relationships, Family, Friends, Church, Hopefulness, Conservator, museum/gallery, and Able to Communicate Effectively Pt states she is kind, a good listener, patient, loving.   Barriers:  none   Legal History: Pending legal issue / charges: The patient has no significant history of legal issues. History of legal issue / charges:  n/a  Medical History/Surgical History: reviewed Past Medical History:  Diagnosis Date   Fibroids    Uterine   Frequent headaches    Hypertension    Kidney stone     Past Surgical History:  Procedure Laterality Date   CESAREAN SECTION  2014    Medications: Current  Outpatient Medications  Medication Sig Dispense Refill   busPIRone (BUSPAR) 5 MG tablet Take 1 tablet (5 mg total) by mouth 2 (two) times daily. For anxiety 60 tablet 0   FLUoxetine (PROZAC) 20 MG capsule Take 1 capsule (20 mg total) by mouth daily. for anxiety. Take with 40 mg. 90 capsule 0   FLUoxetine (PROZAC) 40 MG capsule Take 1 capsule (40 mg total) by mouth daily. For anxiety 90 capsule 0   levonorgestrel (MIRENA) 20 MCG/24HR IUD 1 each by Intrauterine route once.     lisinopril (ZESTRIL) 20 MG tablet Take 1 tablet (20 mg total) by mouth daily. For blood pressure. 90 tablet 3   Multiple Vitamin (  MULTIVITAMIN) capsule Take 1 capsule by mouth daily.     VITAMIN D, ERGOCALCIFEROL, PO Take by mouth. Taking 2 gummy daily.     No current facility-administered medications for this visit.    Allergies  Allergen Reactions   Amoxicillin Hives   Amlodipine Swelling    Diagnoses:  F41.1  Plan of Care: Recommend ongoing therapy.   Pt participated in setting treatment goals.  Pt would like to improve sleep and decrease anxiety and improve coping skills.    Treatment Plan Client Abilities/Strengths  Pt is bright, engaging, and motivated for therapy.  Client Treatment Preferences  Individual therapy.  Client Statement of Needs  Improve coping skills. Improve sleep and decrease anxiety.  Symptoms  Complains of difficulty falling asleep. Complains of difficulty remaining asleep. Excessive and/or unrealistic worry that is difficult to control occurring more days than not for at least 6 months about a number of events or activities. Hypervigilance (e.g., feeling constantly on edge, experiencing concentration difficulties, having trouble falling or staying asleep, exhibiting a general state of irritability). Motor tension (e.g., restlessness, tiredness, shakiness, muscle tension). Recognition of repetitive thoughts and/or behaviors as being excessive and unreasonable, not realistic worries about  life's problems. Repetitive and/or excessive mental or behavioral actions are done to neutralize or prevent discomfort or some dreaded outcome.  Problems Addressed  Obsessive-Compulsive Disorder (OCD), Anxiety, Sleep Disturbance  Goals 1. Accept the presence of obsessive thoughts without acting on them and commit to a value-driven life. 2. Enhance ability to effectively cope with the full variety of life's worries and anxieties. 3. Feel refreshed and energetic during wakeful hours. 4. Function daily at a consistent level with minimal interference from obsessions and compulsions. 5. Learn and implement coping skills that result in a reduction of anxiety and worry, and improved daily functioning. 6. Let go of key thoughts, beliefs, and past life events in order to maximize time free from obsessions and compulsions. 7. Reduce overall frequency, intensity, and duration of the anxiety so that daily functioning is not impaired. Objective Learn and implement calming skills to reduce overall anxiety and manage anxiety symptoms. Target Date: 2022-11-28 Frequency: Biweekly  Progress: 10 Modality: individual  Related Interventions Teach the client calming/relaxation skills (e.g., applied relaxation, progressive muscle relaxation, cue controlled relaxation; mindful breathing; biofeedback) and how to discriminate better between relaxation and tension; teach the client how to apply these skills to his/her daily life (e.g., New Directions in Progressive Muscle Relaxation by Casper Harrison, and Hazlett-Stevens; Treating Generalized Anxiety Disorder by Rygh and Amparo Bristol). Assign the client homework each session in which he/she practices relaxation exercises daily, gradually applying them progressively from non-anxiety-provoking to anxiety-provoking situations; review and reinforce success while providing corrective feedback toward improvement. Assign the client to read about progressive muscle relaxation and  other calming strategies in relevant books or treatment manuals (e.g., Progressive Relaxation Training by Gwynneth Aliment and Dani Gobble; Mastery of Your Anxiety and Worry: Workbook by Beckie Busing). Objective Identify, challenge, and replace biased, fearful self-talk with positive, realistic, and empowering self-talk. Target Date: 2022-11-28 Frequency: Biweekly  Progress: 10 Modality: individual  Related Interventions Explore the client's schema and self-talk that mediate his/her fear response; assist him/her in challenging the biases; replace the distorted messages with reality-based alternatives and positive, realistic self-talk that will increase his/her self-confidence in coping with irrational fears (see Cognitive Therapy of Anxiety Disorders by Alison Stalling). Assign the client a homework exercise in which he/she identifies fearful self-talk, identifies biases in the self-talk, generates alternatives, and tests  through behavioral experiments (or assign "Negative Thoughts Trigger Negative Feelings" in the Adult Psychotherapy Homework Planner by [facility]); review and reinforce success, providing corrective feedback toward improvement. 8. Reduce the frequency, intensity, and duration of obsessions and/or compulsions. Objective Keep a daily journal of obsessions, compulsions, and triggers; record thoughts, feelings, and actions taken. Target Date: 2022-11-28 Frequency: Biweekly  Progress: 10 Modality: individual  Related Interventions Ask the client to self-monitor obsessions, compulsions, and triggers; record thoughts, feelings, and actions taken; routinely process the data to facilitate the accomplishment of therapeutic objectives (or assign "Analyze the Probability of a Feared Event" in the Adult Psychotherapy Homework Planner by Bryn Gulling). 9. Reduce time involved with or interference from obsessions and compulsions. 10. Resolve key life conflicts and the emotional stress that fuels  obsessive-compulsive behavior patterns. 11. Resolve the core conflict that is the source of anxiety. 12. Restore restful sleep pattern. Objective Learn and implement stimulus control strategies to establish a consistent sleep-wake rhythm. Target Date: 2022-11-28 Frequency: Biweekly  Progress: 10 Modality: individual  Related Interventions Discuss with the client the rationale for stimulus control strategies to establish a consistent sleep-wake cycle (see Behavioral Treatments for Sleep Disorders by Kittie Plater, and Orma Render). Teach the client stimulus control techniques (e.g., lie down to sleep only when sleepy; do not use the bed for activities like watching television, reading, listening to music, but only for sleep or sexual activity; get out of bed if sleep doesn't arrive soon after retiring; lie back down when sleepy; set alarm to the same wake-up time every morning regardless of sleep time or quality; do not nap during the day); assign consistent implementation. Instruct the client to move activities associated with arousal and activation from the bedtime ritual to other times during the day (e.g., reading stimulating content, reviewing day's events, planning for next day, watching disturbing television). Monitor the client's sleep patterns and compliance with stimulus control instructions; problem-solve obstacles and reinforce successful, consistent implementation. Objective Learn and implement a sleep restriction method to increase sleep efficiency. Target Date: 2022-11-28 Frequency: Biweekly  Progress: 10 Modality: individual  Related Interventions Use a sleep restriction therapy approach in which the amount of time in bed is reduced to match the amount of time the patient typically sleeps (e.g., from 8 hours to 5), thus inducing systematic sleep deprivation; periodically adjust sleep time upward until an optimal sleep duration is reached. Objective Learn and implement calming skills for  use at bedtime. Target Date: 2022-11-28 Frequency: Daily  Progress: 0 Modality: individual  Related Interventions Teach the client relaxation skills (e.g., progressive muscle relaxation, guided imagery, slow diaphragmatic breathing); teach the client how to apply these skills to facilitate relaxation and sleep at bedtime (see "Bedtime Relaxation Techniques" by Blima Ledger). Refer the client for or conduct biofeedback training to strengthen the client's successful relaxation response. 13. Stabilize anxiety level while increasing ability to function on a daily basis. Diagnosis Axis none F41.1 (Generalized anxiety disorder)  Generalized Anxiety Disorder   Conditions For Discharge Achievement of treatment goals and objectives  Clint Bolder, LCSW

## 2021-11-29 MED ORDER — BUSPIRONE HCL 10 MG PO TABS: 10.0000 mg | ORAL_TABLET | Freq: Two times a day (BID) | ORAL | 0 refills | Status: DC

## 2021-11-29 NOTE — Telephone Encounter (Signed)
Please see the MyChart message reply(ies) for my assessment and plan.  The patient gave consent for this Medical Advice Message and is aware that it may result in a bill to their insurance company as well as the possibility that this may result in a co-payment or deductible. They are an established patient, but are not seeking medical advice exclusively about a problem treated during an in person or video visit in the last 7 days. I did not recommend an in person or video visit within 7 days of my reply.  I spent a total of 10 minutes cumulative time within 7 days through [place], NP

## 2021-12-03 ENCOUNTER — Ambulatory Visit (INDEPENDENT_AMBULATORY_CARE_PROVIDER_SITE_OTHER): Payer: BC Managed Care – PPO | Admitting: Psychology

## 2021-12-03 DIAGNOSIS — F411 Generalized anxiety disorder: Secondary | ICD-10-CM

## 2021-12-03 NOTE — Progress Notes (Addendum)
[facility] Counselor/Therapist Progress Note  Patient ID: Marie Barnett, MRN: [id number],    Date: 12/03/2021  Time Spent: 4:00pm-4:55pm    55 minutes   Treatment Type: Individual Therapy  Reported Symptoms: anxiety  Mental Status Exam: Appearance:  Casual     Behavior: Appropriate  Motor: Normal  Speech/Language:  Normal Rate  Affect: Appropriate  Mood: normal  Thought process: normal  Thought content:   WNL  Sensory/Perceptual disturbances:   WNL  Orientation: oriented to person, place, time/date, and situation  Attention: Good  Concentration: Good  Memory: WNL  Fund of knowledge:  Good  Insight:   Good  Judgment:  Good  Impulse Control: Good   Risk Assessment: Danger to Self:  No Self-injurious Behavior: No Danger to Others: No Duty to Warn:no Physical Aggression / Violence:No  Access to Firearms a concern: No  Gang Involvement:No   Subjective:  Pt present for face-to-face individual therapy via video Webex.  Pt consents to telehealth video session due to COVID 19 pandemic. Location of pt: home Location of therapist: home office.   Pt completed sleep questionnaires and sleep diary.   Addressed the results with her.  There is an intermediate risk for sleep apnea.  This could attribute to pt's daytime sleepiness.   Recommended pt get a sleep study to assess and treat sleep apnea concerns.   Identified that pt's circadian rhythm is that of a "night owl" and is impacting what time she goes to bed.   Identified that pt's middle of the night awakenings are "normal" and she gets back to sleep within 5-15 minutes.   Addressed how behavioral changes such as not looking at her phone and the time can shorten the amount of time she is awake in the middle of the night.   Worked on ways pt can "quiet her mind" so she does not get over stimulated in the middle of the night.  Encouraged pt to not get on her phone at those times.   Talked with pt about the importance of  not taking daytime naps due to their impact on nighttime sleep disruption.   Encouraged pt to continue to complete the sleep diaries.   Need to have additional data to use for more specific sleep prescription strategies.   Plan to meet in two weeks.      Interventions: Cognitive Behavioral Therapy and Insight-Oriented  Diagnosis:Generalized anxiety disorder  Plan of Care: Recommend ongoing therapy.   Pt participated in setting treatment goals.  Pt would like to improve sleep and decrease anxiety and improve coping skills.    Treatment Plan (Treatment Plan Target Date: 11/28/2022) Client Abilities/Strengths  Pt is bright, engaging, and motivated for therapy.  Client Treatment Preferences  Individual therapy.  Client Statement of Needs  Improve coping skills. Improve sleep and decrease anxiety.  Symptoms  Complains of difficulty falling asleep. Complains of difficulty remaining asleep. Excessive and/or unrealistic worry that is difficult to control occurring more days than not for at least 6 months about a number of events or activities. Hypervigilance (e.g., feeling constantly on edge, experiencing concentration difficulties, having trouble falling or staying asleep, exhibiting a general state of irritability). Motor tension (e.g., restlessness, tiredness, shakiness, muscle tension). Recognition of repetitive thoughts and/or behaviors as being excessive and unreasonable, not realistic worries about life's problems. Repetitive and/or excessive mental or behavioral actions are done to neutralize or prevent discomfort or some dreaded outcome.  Problems Addressed  Obsessive-Compulsive Disorder (OCD), Anxiety, Sleep Disturbance  Goals 1. Accept  the presence of obsessive thoughts without acting on them and commit to a value-driven life. 2. Enhance ability to effectively cope with the full variety of life's worries and anxieties. 3. Feel refreshed and energetic during wakeful hours. 4. Function  daily at a consistent level with minimal interference from obsessions and compulsions. 5. Learn and implement coping skills that result in a reduction of anxiety and worry, and improved daily functioning. 6. Let go of key thoughts, beliefs, and past life events in order to maximize time free from obsessions and compulsions. 7. Reduce overall frequency, intensity, and duration of the anxiety so that daily functioning is not impaired. Objective Learn and implement calming skills to reduce overall anxiety and manage anxiety symptoms. Target Date: 2022-11-28 Frequency: Biweekly  Progress: 10 Modality: individual  Related Interventions Teach the client calming/relaxation skills (e.g., applied relaxation, progressive muscle relaxation, cue controlled relaxation; mindful breathing; biofeedback) and how to discriminate better between relaxation and tension; teach the client how to apply these skills to his/her daily life (e.g., New Directions in Progressive Muscle Relaxation by Casper Harrison, and Hazlett-Stevens; Treating Generalized Anxiety Disorder by Rygh and Amparo Bristol). Assign the client homework each session in which he/she practices relaxation exercises daily, gradually applying them progressively from non-anxiety-provoking to anxiety-provoking situations; review and reinforce success while providing corrective feedback toward improvement. Assign the client to read about progressive muscle relaxation and other calming strategies in relevant books or treatment manuals (e.g., Progressive Relaxation Training by Gwynneth Aliment and Dani Gobble; Mastery of Your Anxiety and Worry: Workbook by Beckie Busing). Objective Identify, challenge, and replace biased, fearful self-talk with positive, realistic, and empowering self-talk. Target Date: 2022-11-28 Frequency: Biweekly  Progress: 10 Modality: individual  Related Interventions Explore the client's schema and self-talk that mediate his/her fear response;  assist him/her in challenging the biases; replace the distorted messages with reality-based alternatives and positive, realistic self-talk that will increase his/her self-confidence in coping with irrational fears (see Cognitive Therapy of Anxiety Disorders by Alison Stalling). Assign the client a homework exercise in which he/she identifies fearful self-talk, identifies biases in the self-talk, generates alternatives, and tests through behavioral experiments (or assign "Negative Thoughts Trigger Negative Feelings" in the Adult Psychotherapy Homework Planner by [facility]); review and reinforce success, providing corrective feedback toward improvement. 8. Reduce the frequency, intensity, and duration of obsessions and/or compulsions. Objective Keep a daily journal of obsessions, compulsions, and triggers; record thoughts, feelings, and actions taken. Target Date: 2022-11-28 Frequency: Biweekly  Progress: 10 Modality: individual  Related Interventions Ask the client to self-monitor obsessions, compulsions, and triggers; record thoughts, feelings, and actions taken; routinely process the data to facilitate the accomplishment of therapeutic objectives (or assign "Analyze the Probability of a Feared Event" in the Adult Psychotherapy Homework Planner by Bryn Gulling). 9. Reduce time involved with or interference from obsessions and compulsions. 10. Resolve key life conflicts and the emotional stress that fuels obsessive-compulsive behavior patterns. 11. Resolve the core conflict that is the source of anxiety. 12. Restore restful sleep pattern. Objective Learn and implement stimulus control strategies to establish a consistent sleep-wake rhythm. Target Date: 2022-11-28 Frequency: Biweekly  Progress: 10 Modality: individual  Related Interventions Discuss with the client the rationale for stimulus control strategies to establish a consistent sleep-wake cycle (see Behavioral Treatments for Sleep Disorders by Kittie Plater, and Orma Render). Teach the client stimulus control techniques (e.g., lie down to sleep only when sleepy; do not use the bed for activities like watching television, reading, listening to music, but only for sleep or  sexual activity; get out of bed if sleep doesn't arrive soon after retiring; lie back down when sleepy; set alarm to the same wake-up time every morning regardless of sleep time or quality; do not nap during the day); assign consistent implementation. Instruct the client to move activities associated with arousal and activation from the bedtime ritual to other times during the day (e.g., reading stimulating content, reviewing day's events, planning for next day, watching disturbing television). Monitor the client's sleep patterns and compliance with stimulus control instructions; problem-solve obstacles and reinforce successful, consistent implementation. Objective Learn and implement a sleep restriction method to increase sleep efficiency. Target Date: 2022-11-28 Frequency: Biweekly  Progress: 10 Modality: individual  Related Interventions Use a sleep restriction therapy approach in which the amount of time in bed is reduced to match the amount of time the patient typically sleeps (e.g., from 8 hours to 5), thus inducing systematic sleep deprivation; periodically adjust sleep time upward until an optimal sleep duration is reached. Objective Learn and implement calming skills for use at bedtime. Target Date: 2022-11-28 Frequency: Daily  Progress: 0 Modality: individual  Related Interventions Teach the client relaxation skills (e.g., progressive muscle relaxation, guided imagery, slow diaphragmatic breathing); teach the client how to apply these skills to facilitate relaxation and sleep at bedtime (see "Bedtime Relaxation Techniques" by Blima Ledger). Refer the client for or conduct biofeedback training to strengthen the client's successful relaxation response. 13. Stabilize anxiety  level while increasing ability to function on a daily basis. Diagnosis Axis none F41.1 (Generalized anxiety disorder)  Generalized Anxiety Disorder   Conditions For Discharge Achievement of treatment goals and objectives  Clint Bolder, LCSW

## 2021-12-17 ENCOUNTER — Ambulatory Visit: Payer: BC Managed Care – PPO | Admitting: Psychology

## 2021-12-21 ENCOUNTER — Other Ambulatory Visit: Payer: Self-pay | Admitting: Primary Care

## 2021-12-21 DIAGNOSIS — F411 Generalized anxiety disorder: Secondary | ICD-10-CM

## 2021-12-31 ENCOUNTER — Ambulatory Visit: Payer: BC Managed Care – PPO | Admitting: Psychology

## 2022-01-30 ENCOUNTER — Other Ambulatory Visit: Payer: Self-pay | Admitting: Primary Care

## 2022-01-30 DIAGNOSIS — G4700 Insomnia, unspecified: Secondary | ICD-10-CM

## 2022-01-30 DIAGNOSIS — F411 Generalized anxiety disorder: Secondary | ICD-10-CM

## 2022-01-30 MED ORDER — FLUOXETINE HCL 40 MG PO CAPS: 40.0000 mg | ORAL_CAPSULE | Freq: Every day | ORAL | 0 refills | Status: DC

## 2022-01-30 MED ORDER — FLUOXETINE HCL 20 MG PO CAPS: 20.0000 mg | ORAL_CAPSULE | Freq: Every day | ORAL | 0 refills | Status: DC

## 2022-03-12 ENCOUNTER — Ambulatory Visit: Payer: BC Managed Care – PPO | Admitting: Psychology

## 2022-03-31 ENCOUNTER — Other Ambulatory Visit: Payer: Self-pay | Admitting: Primary Care

## 2022-03-31 DIAGNOSIS — F411 Generalized anxiety disorder: Secondary | ICD-10-CM

## 2022-03-31 DIAGNOSIS — G4700 Insomnia, unspecified: Secondary | ICD-10-CM

## 2022-04-02 ENCOUNTER — Other Ambulatory Visit: Payer: Self-pay | Admitting: Primary Care

## 2022-04-02 DIAGNOSIS — F411 Generalized anxiety disorder: Secondary | ICD-10-CM

## 2022-04-02 DIAGNOSIS — G4700 Insomnia, unspecified: Secondary | ICD-10-CM

## 2022-04-30 ENCOUNTER — Other Ambulatory Visit: Payer: Self-pay | Admitting: Primary Care

## 2022-04-30 DIAGNOSIS — G4700 Insomnia, unspecified: Secondary | ICD-10-CM

## 2022-04-30 DIAGNOSIS — F411 Generalized anxiety disorder: Secondary | ICD-10-CM

## 2022-04-30 NOTE — Telephone Encounter (Signed)
Lvmtcb, sent pt mychart message

## 2022-04-30 NOTE — Telephone Encounter (Signed)
Patient is due for CPE/follow up in early January 2024, this will be required prior to any further refills.  Please schedule.

## 2022-05-28 ENCOUNTER — Other Ambulatory Visit: Payer: Self-pay | Admitting: Primary Care

## 2022-05-28 DIAGNOSIS — I10 Essential (primary) hypertension: Secondary | ICD-10-CM

## 2022-06-25 ENCOUNTER — Other Ambulatory Visit: Payer: Self-pay | Admitting: Primary Care

## 2022-06-25 DIAGNOSIS — G4700 Insomnia, unspecified: Secondary | ICD-10-CM

## 2022-06-25 DIAGNOSIS — F411 Generalized anxiety disorder: Secondary | ICD-10-CM

## 2022-06-26 ENCOUNTER — Other Ambulatory Visit: Payer: Self-pay | Admitting: Primary Care

## 2022-06-26 DIAGNOSIS — G4700 Insomnia, unspecified: Secondary | ICD-10-CM

## 2022-06-26 DIAGNOSIS — F411 Generalized anxiety disorder: Secondary | ICD-10-CM

## 2022-06-26 MED ORDER — FLUOXETINE HCL 40 MG PO CAPS: 40.0000 mg | ORAL_CAPSULE | Freq: Every day | ORAL | 0 refills | Status: DC

## 2022-06-26 MED ORDER — FLUOXETINE HCL 20 MG PO CAPS: 20.0000 mg | ORAL_CAPSULE | Freq: Every day | ORAL | 0 refills | Status: DC

## 2022-06-26 NOTE — Telephone Encounter (Signed)
From: Marzetta Board Everard To: Office of [name], NP Sent: 06/26/2022 12:11 PM EST Subject: Medication Renewal Request  Refills have been requested for the following medications:   FLUoxetine (PROZAC) 20 MG capsule [Evaleigh Mccamy K Taylyn Brame]  Patient Comment: I have an appt scheduled for Feb. 23rd. We do not have insurance until February. My husband changed jobs.    FLUoxetine (PROZAC) 40 MG capsule [Jonatan Wilsey K Cherissa Hook]  [id number] [place] [address]: [employer]

## 2022-07-24 ENCOUNTER — Other Ambulatory Visit: Payer: Self-pay | Admitting: Primary Care

## 2022-07-24 DIAGNOSIS — F411 Generalized anxiety disorder: Secondary | ICD-10-CM

## 2022-08-19 ENCOUNTER — Telehealth: Payer: Self-pay | Admitting: Primary Care

## 2022-08-19 DIAGNOSIS — I10 Essential (primary) hypertension: Secondary | ICD-10-CM

## 2022-08-19 MED ORDER — LISINOPRIL 20 MG PO TABS: 20.0000 mg | ORAL_TABLET | Freq: Every day | ORAL | 0 refills | Status: DC

## 2022-08-19 NOTE — Telephone Encounter (Signed)
Prescription Request  08/19/2022  Is this a "Controlled Substance" medicine? No  LOV: Visit date not found  What is the name of the medication or equipment? lisinopril (ZESTRIL) 20 MG tablet   Have you contacted your pharmacy to request a refill? No   Which pharmacy would you like this sent to?  [id number]: [phone]: [phone] [id number] [place] [address]: [phone]: [phone]  Patient notified that their request is being sent to the clinical staff for review and that they should receive a response within 2 business days.   Please advise at Mobile [phone])

## 2022-08-19 NOTE — Telephone Encounter (Signed)
Refills sent to pharmacy. 

## 2022-08-23 ENCOUNTER — Ambulatory Visit: Payer: BC Managed Care – PPO | Admitting: Primary Care

## 2022-08-30 ENCOUNTER — Other Ambulatory Visit: Payer: Managed Care, Other (non HMO)

## 2022-08-30 ENCOUNTER — Ambulatory Visit (INDEPENDENT_AMBULATORY_CARE_PROVIDER_SITE_OTHER): Payer: Managed Care, Other (non HMO) | Admitting: Primary Care

## 2022-08-30 ENCOUNTER — Encounter: Payer: Self-pay | Admitting: Primary Care

## 2022-08-30 VITALS — BP 128/82 | HR 73 | Temp 97.3°F | Ht 64.25 in | Wt 202.0 lb

## 2022-08-30 DIAGNOSIS — R7303 Prediabetes: Secondary | ICD-10-CM | POA: Diagnosis not present

## 2022-08-30 DIAGNOSIS — E785 Hyperlipidemia, unspecified: Secondary | ICD-10-CM

## 2022-08-30 DIAGNOSIS — I10 Essential (primary) hypertension: Secondary | ICD-10-CM | POA: Diagnosis not present

## 2022-08-30 DIAGNOSIS — Z0001 Encounter for general adult medical examination with abnormal findings: Secondary | ICD-10-CM

## 2022-08-30 DIAGNOSIS — R109 Unspecified abdominal pain: Secondary | ICD-10-CM

## 2022-08-30 DIAGNOSIS — G4700 Insomnia, unspecified: Secondary | ICD-10-CM

## 2022-08-30 DIAGNOSIS — F411 Generalized anxiety disorder: Secondary | ICD-10-CM

## 2022-08-30 DIAGNOSIS — G8929 Other chronic pain: Secondary | ICD-10-CM

## 2022-08-30 LAB — COMPREHENSIVE METABOLIC PANEL
ALT: 13 U/L (ref 0–35)
AST: 13 U/L (ref 0–37)
Albumin: 4.1 g/dL (ref 3.5–5.2)
Alkaline Phosphatase: 65 U/L (ref 39–117)
BUN: 16 mg/dL (ref 6–23)
CO2: 30 mEq/L (ref 19–32)
Calcium: 9.3 mg/dL (ref 8.4–10.5)
Chloride: 102 mEq/L (ref 96–112)
Creatinine, Ser: 0.83 mg/dL (ref 0.40–1.20)
GFR: 86.15 mL/min (ref >60.00)
Glucose, Bld: 86 mg/dL (ref 70–99)
Potassium: 4.2 mEq/L (ref 3.5–5.1)
Sodium: 137 mEq/L (ref 135–145)
Total Bilirubin: 0.6 mg/dL (ref 0.2–1.2)
Total Protein: 7.4 g/dL (ref 6.0–8.3)

## 2022-08-30 LAB — CBC
HCT: 42.6 % (ref 36.0–46.0)
Hemoglobin: 14.3 g/dL (ref 12.0–15.0)
MCHC: 33.7 g/dL (ref 30.0–36.0)
MCV: 90 fl (ref 78.0–100.0)
Platelets: 373 10*3/uL (ref 150.0–400.0)
RBC: 4.73 Mil/uL (ref 3.87–5.11)
RDW: 14.7 % (ref 11.5–15.5)
WBC: 8.1 10*3/uL (ref 4.0–10.5)

## 2022-08-30 LAB — LIPID PANEL
Cholesterol: 237 mg/dL — ABNORMAL HIGH (ref 0–200)
HDL: 40.6 mg/dL (ref >39.00)
LDL Cholesterol: 171 mg/dL — ABNORMAL HIGH (ref 0–99)
NonHDL: 196.26
Total CHOL/HDL Ratio: 6
Triglycerides: 124 mg/dL (ref 0.0–149.0)
VLDL: 24.8 mg/dL (ref 0.0–40.0)

## 2022-08-30 LAB — HEMOGLOBIN A1C: Hgb A1c MFr Bld: 5.6 % (ref 4.6–6.5)

## 2022-08-30 MED ORDER — BUPROPION HCL ER (SR) 100 MG PO TB12: 100.0000 mg | ORAL_TABLET | Freq: Every day | ORAL | 0 refills | Status: DC

## 2022-08-30 NOTE — Assessment & Plan Note (Signed)
Immunizations UTD. Pap smear due, she will have this done at her GYN office Mammogram due, she will have this done at her GYN office.  Discussed the importance of a healthy diet and regular exercise in order for weight loss, and to reduce the risk of further co-morbidity.  Exam stable. Labs pending.  Follow up in 1 year for repeat physical.

## 2022-08-30 NOTE — Assessment & Plan Note (Signed)
Repeat lipid panel pending.  Discussed the importance of a healthy diet and regular exercise in order for weight loss, and to reduce the risk of further co-morbidity. Commended her on walking and improving diet.

## 2022-08-30 NOTE — Patient Instructions (Signed)
Stop by the lab prior to leaving today. I will notify you of your results once received.   Start bupropion) Wellbutrin) SR 100 mg daily in the morning for anxiety. Continue fluoxetine.  You will be contacted via phone regarding your ultrasound.  Please update me in 1 regarding the new medication.  It was a pleasure to see you today!

## 2022-08-30 NOTE — Assessment & Plan Note (Signed)
Repeat A1C pending.  Discussed the importance of a healthy diet and regular exercise in order for weight loss, and to reduce the risk of further co-morbidity. Commended her on regular exercise and improved diet.

## 2022-08-30 NOTE — Progress Notes (Signed)
Subjective:    Patient ID: Marie Barnett, female    DOB: Nov 02, 1980, 42 y.o.   MRN: [id number]  HPI  Marie Barnett is a very pleasant 42 y.o. female who presents today for complete physical and follow up of chronic conditions.  She continues to notice ODD type of behaviors. She is managed on fluoxetine 60 mg which helps with anxiety but doesn't help with ODD behaviors.  She questions if there is treatment that can help with the symptoms.  Previously following with therapy for insomnia, plans on resuming soon as her insurance will now cover.  She would also like to mention left lower flank pain. Chronic for the last 1 year, intermittent, occurs with and without activity lasting for a few hours. She describes her pain as sharp, dull, achy. She denies hematuria, radiation of pain, difficulty urinating. She has a history of renal stones and this feels similar.  She cannot provoke the pain with movement.  She denies pain with walking and other physical activity.  Immunizations: -Tetanus: Completed in 2022 -Influenza: Completed this season  Diet: [place]. Improved diet.  Exercise: Walking, active at work.  Eye exam: Completed several years ago Dental exam: Completed several years ago   Pap Smear: Completes with GYN. Due this year.  Mammogram: Completed in October 2021, scheduled for May.    BP Readings from Last 3 Encounters:  08/30/22 128/82  06/01/21 132/84  05/19/19 123/79   Wt Readings from Last 3 Encounters:  08/30/22 202 lb (91.6 kg)  08/17/21 216 lb (98 kg)  06/01/21 216 lb 9 oz (98.2 kg)       Review of Systems  Constitutional:  Negative for unexpected weight change.  HENT:  Negative for rhinorrhea.   Respiratory:  Negative for cough and shortness of breath.   Cardiovascular:  Negative for chest pain.  Gastrointestinal:  Negative for constipation and diarrhea.  Genitourinary:  Positive for flank pain. Negative for difficulty urinating.  Musculoskeletal:  Negative  for arthralgias and myalgias.  Skin:  Negative for rash.  Allergic/Immunologic: Negative for environmental allergies.  Neurological:  Negative for dizziness and headaches.  Psychiatric/Behavioral:  Positive for sleep disturbance. The patient is nervous/anxious.        See HPI         Past Medical History:  Diagnosis Date   Fibroids    Uterine   Frequent headaches    Hypertension    Kidney stone     Social History   Socioeconomic History   Marital status: Married    Spouse name: Not on file   Number of children: Not on file   Years of education: Not on file   Highest education level: Not on file  Occupational History   Not on file  Tobacco Use   Smoking status: Never   Smokeless tobacco: Never  Substance and Sexual Activity   Alcohol use: Yes    Alcohol/week: 0.0 standard drinks of alcohol    Comment: social   Drug use: Not on file   Sexual activity: Not on file  Other Topics Concern   Not on file  Social History Narrative   Married.   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   Enjoys spending time at [employer] and pool.    Social Determinants of Health   Financial Resource Strain: Not on file  Food Insecurity: Not on file  Transportation Needs: Not on file  Physical Activity: Not on file  Stress: Not on file  Social Connections: Not on file  Intimate Partner Violence: Not on file    Past Surgical History:  Procedure Laterality Date   CESAREAN SECTION  2014    Family History  Problem Relation Age of Onset   Hyperlipidemia Mother    Hypertension Mother    Cystic kidney disease Mother    Hyperlipidemia Father    Hypertension Father    Alcohol abuse Brother    Alcohol abuse Maternal Uncle    Hyperlipidemia Maternal Grandmother    Hypertension Maternal Grandmother    Stroke Maternal Grandmother    Depression Maternal Grandmother    Prostate cancer Maternal Grandfather    Stroke Maternal Grandfather    Colon cancer Paternal Grandmother     Bipolar disorder Neg Hx     Allergies  Allergen Reactions   Amoxicillin Hives   Amlodipine Swelling    Current Outpatient Medications on File Prior to Visit  Medication Sig Dispense Refill   FLUoxetine (PROZAC) 20 MG capsule Take 1 capsule (20 mg total) by mouth daily. for anxiety. Take with 40 mg. Office visit required for further refills. 90 capsule 0   FLUoxetine (PROZAC) 40 MG capsule Take 1 capsule (40 mg total) by mouth daily. For anxiety. Office visit required for further refills. 90 capsule 0   levonorgestrel (MIRENA) 20 MCG/24HR IUD 1 each by Intrauterine route once.     lisinopril (ZESTRIL) 20 MG tablet Take 1 tablet (20 mg total) by mouth daily. for blood pressure 30 tablet 0   Multiple Vitamin (MULTIVITAMIN) capsule Take 1 capsule by mouth daily.     VITAMIN D, ERGOCALCIFEROL, PO Take by mouth. Taking 2 gummy daily.     No current facility-administered medications on file prior to visit.    BP 128/82   Pulse 73   Temp (!) 97.3 F (36.3 C) (Temporal)   Ht 5' 4.25" (1.632 m)   Wt 202 lb (91.6 kg)   SpO2 100%   BMI 34.40 kg/m  Objective:   Physical Exam HENT:     Right Ear: Tympanic membrane and ear canal normal.     Left Ear: Tympanic membrane and ear canal normal.     Nose: Nose normal.  Eyes:     Conjunctiva/sclera: Conjunctivae normal.     Pupils: Pupils are equal, round, and reactive to light.  Neck:     Thyroid: No thyromegaly.  Cardiovascular:     Rate and Rhythm: Normal rate and regular rhythm.     Heart sounds: No murmur heard. Pulmonary:     Effort: Pulmonary effort is normal.     Breath sounds: Normal breath sounds. No rales.  Abdominal:     General: Bowel sounds are normal.     Palpations: Abdomen is soft.     Tenderness: There is no abdominal tenderness. There is no right CVA tenderness or left CVA tenderness.  Musculoskeletal:        General: Normal range of motion.     Cervical back: Neck supple.  Lymphadenopathy:     Cervical: No  cervical adenopathy.  Skin:    General: Skin is warm and dry.     Findings: No rash.  Neurological:     Mental Status: She is alert and oriented to person, place, and time.     Cranial Nerves: No cranial nerve deficit.     Deep Tendon Reflexes: Reflexes are normal and symmetric.  Psychiatric:        Mood and Affect: Mood normal.  Assessment & Plan:  Encounter for annual general medical examination with abnormal findings in adult Assessment & Plan: Immunizations UTD. Pap smear due, she will have this done at her GYN office Mammogram due, she will have this done at her GYN office.  Discussed the importance of a healthy diet and regular exercise in order for weight loss, and to reduce the risk of further co-morbidity.  Exam stable. Labs pending.  Follow up in 1 year for repeat physical.    GAD (generalized anxiety disorder) Assessment & Plan: Overall controlled. Continues with ODD type behaviors.   Continue fluoxetine 60 mg daily. Trial of Wellbutrin SR 100 mg daily in AM.  She will update in 1 month.   Orders: -     buPROPion HCl ER (SR); Take 1 tablet (100 mg total) by mouth daily. For anxiety  Dispense: 90 tablet; Refill: 0  Hyperlipidemia, unspecified hyperlipidemia type Assessment & Plan: Repeat lipid panel pending.  Discussed the importance of a healthy diet and regular exercise in order for weight loss, and to reduce the risk of further co-morbidity. Commended her on walking and improving diet.  Orders: -     Lipid panel -     Hemoglobin A1c  Insomnia, unspecified type Assessment & Plan: Improving.  Recommended she get back in with her therapist.  Continue fluoxetine 60 mg daily.    Essential hypertension Assessment & Plan: Controlled.  Continue lisinopril 20 mg daily.  CMP pending.  Orders: -     Lipid panel -     Comprehensive metabolic panel -     CBC  Flank pain, chronic -     US RENAL; Future  Chronic flank  pain Assessment & Plan: No alarm signs.  Renal US ordered and pending for stone evaluation.    Prediabetes Assessment & Plan: Repeat A1C pending.  Discussed the importance of a healthy diet and regular exercise in order for weight loss, and to reduce the risk of further co-morbidity. Commended her on regular exercise and improved diet.  Orders: -     Hemoglobin A1c        [name], NP

## 2022-08-30 NOTE — Assessment & Plan Note (Signed)
Controlled.  Continue lisinopril 20 mg daily.  CMP pending.

## 2022-08-30 NOTE — Assessment & Plan Note (Signed)
Improving.  Recommended she get back in with her therapist.  Continue fluoxetine 60 mg daily.

## 2022-08-30 NOTE — Assessment & Plan Note (Addendum)
Overall controlled. Continues with ODD type behaviors.   Continue fluoxetine 60 mg daily. Trial of Wellbutrin SR 100 mg daily in AM.  She will update in 1 month.

## 2022-08-30 NOTE — Assessment & Plan Note (Signed)
No alarm signs.  Renal US ordered and pending for stone evaluation.

## 2022-09-02 ENCOUNTER — Other Ambulatory Visit: Payer: Self-pay | Admitting: Primary Care

## 2022-09-05 LAB — LIPOPROTEIN A (LPA): Lipoprotein (a): 172 nmol/L — ABNORMAL HIGH (ref <75)

## 2022-09-13 ENCOUNTER — Other Ambulatory Visit: Payer: Managed Care, Other (non HMO)

## 2022-09-19 ENCOUNTER — Ambulatory Visit
Admission: RE | Admit: 2022-09-19 | Discharge: 2022-09-19 | Disposition: A | Discharge status: Home / Self Care | Payer: Managed Care, Other (non HMO) | Source: Ambulatory Visit | Attending: Primary Care | Admitting: Primary Care

## 2022-09-19 DIAGNOSIS — G8929 Other chronic pain: Secondary | ICD-10-CM

## 2022-09-19 IMAGING — MG DIGITAL SCREENING BILAT W/ TOMO W/ CAD
6 of 10 series · 6 of 30 positions shown · non-contrast
Comparison: Previous exam(s).

CLINICAL DATA: Screening.

EXAM:
DIGITAL SCREENING BILATERAL MAMMOGRAM WITH TOMO AND CAD

[R CC synth-2D]
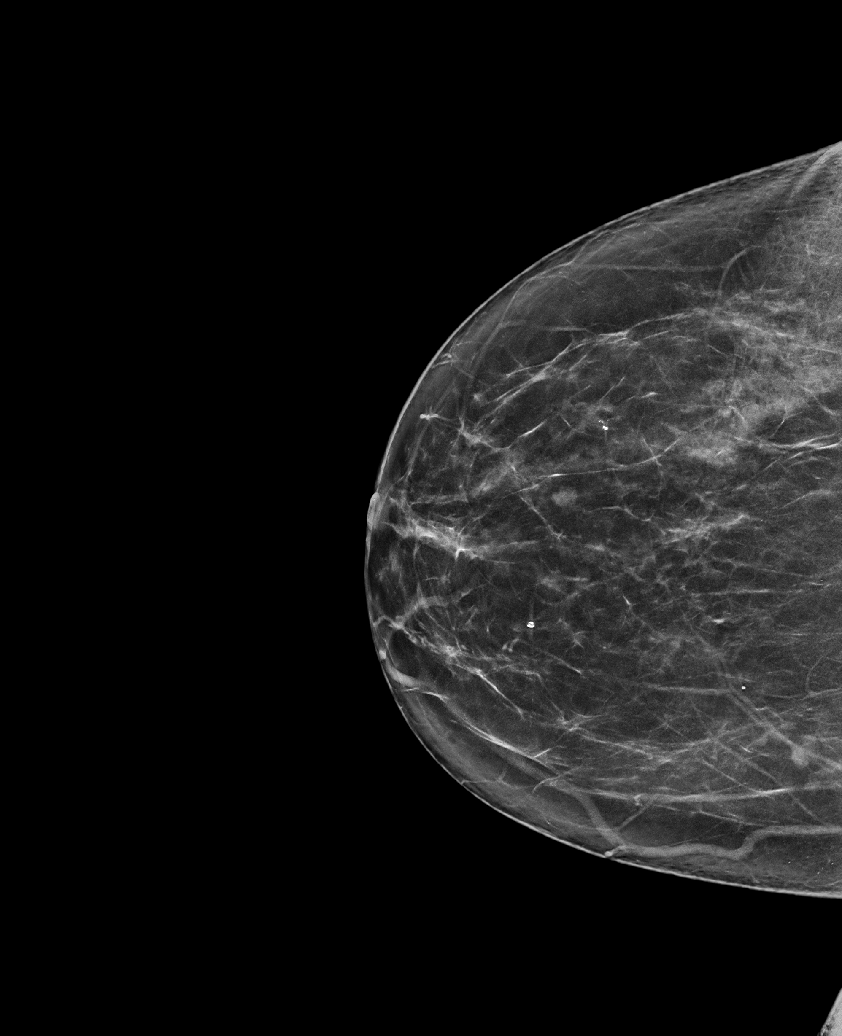

[L CC synth-2D]
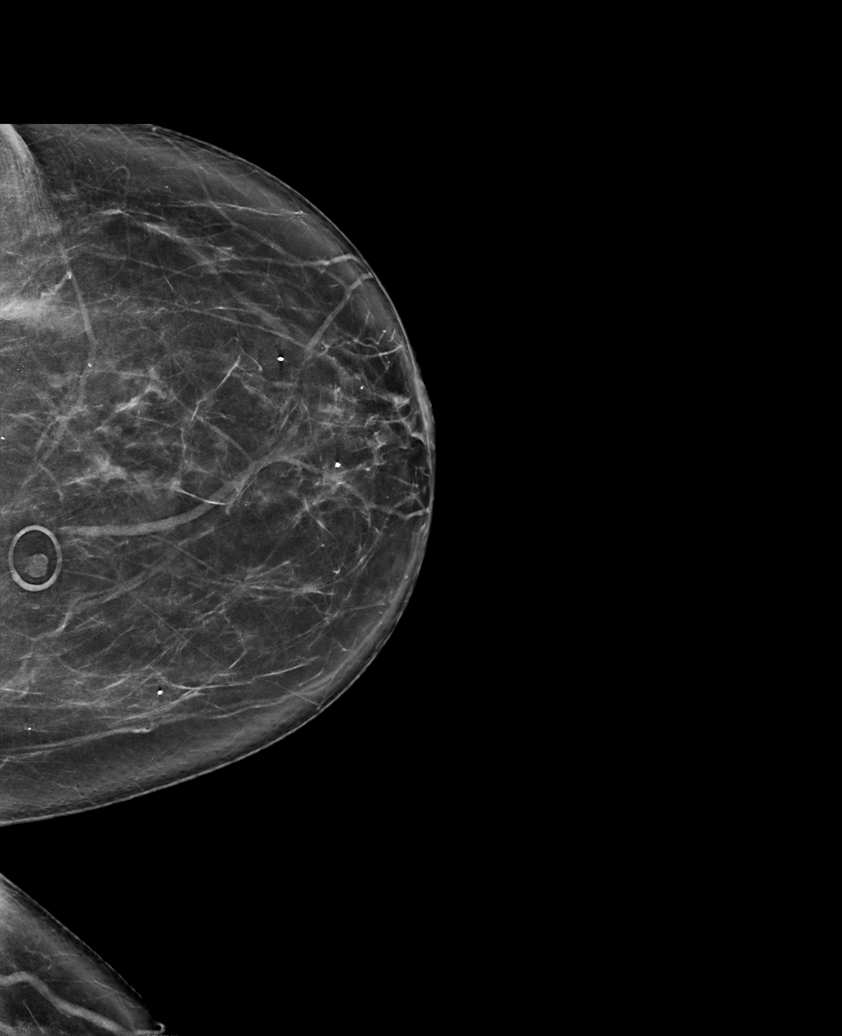

[R MLO synth-2D]
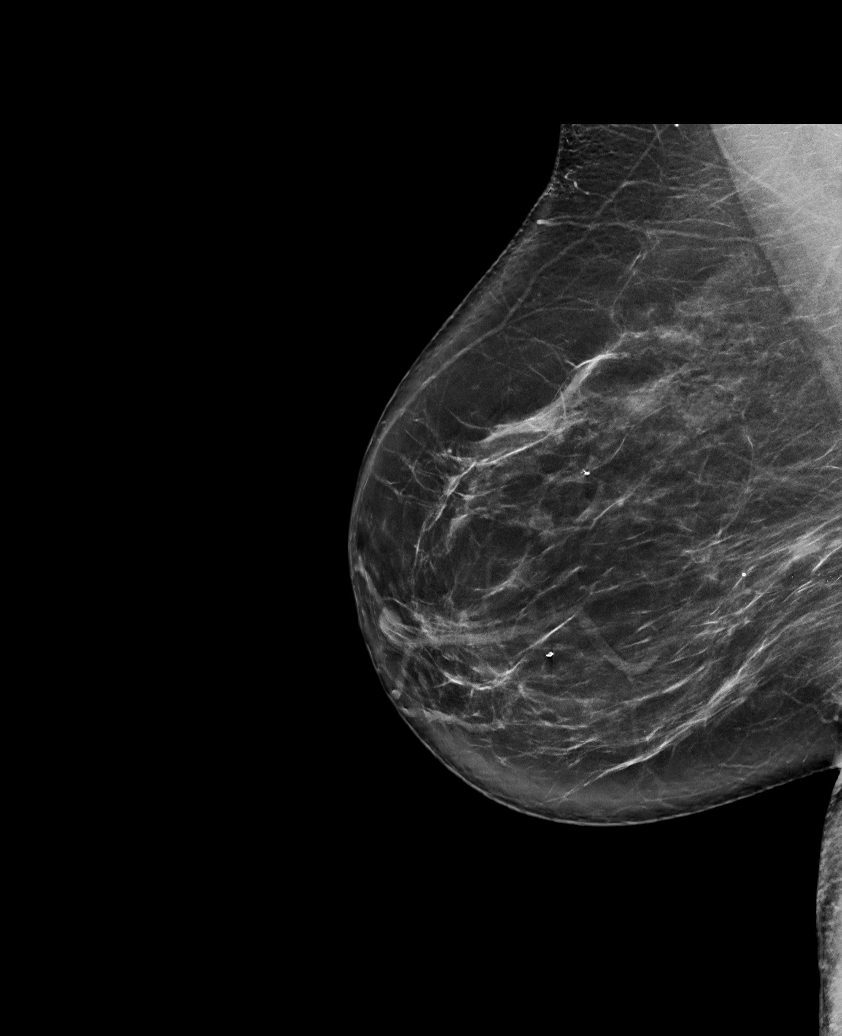

[L MLO synth-2D (1 of 2)]
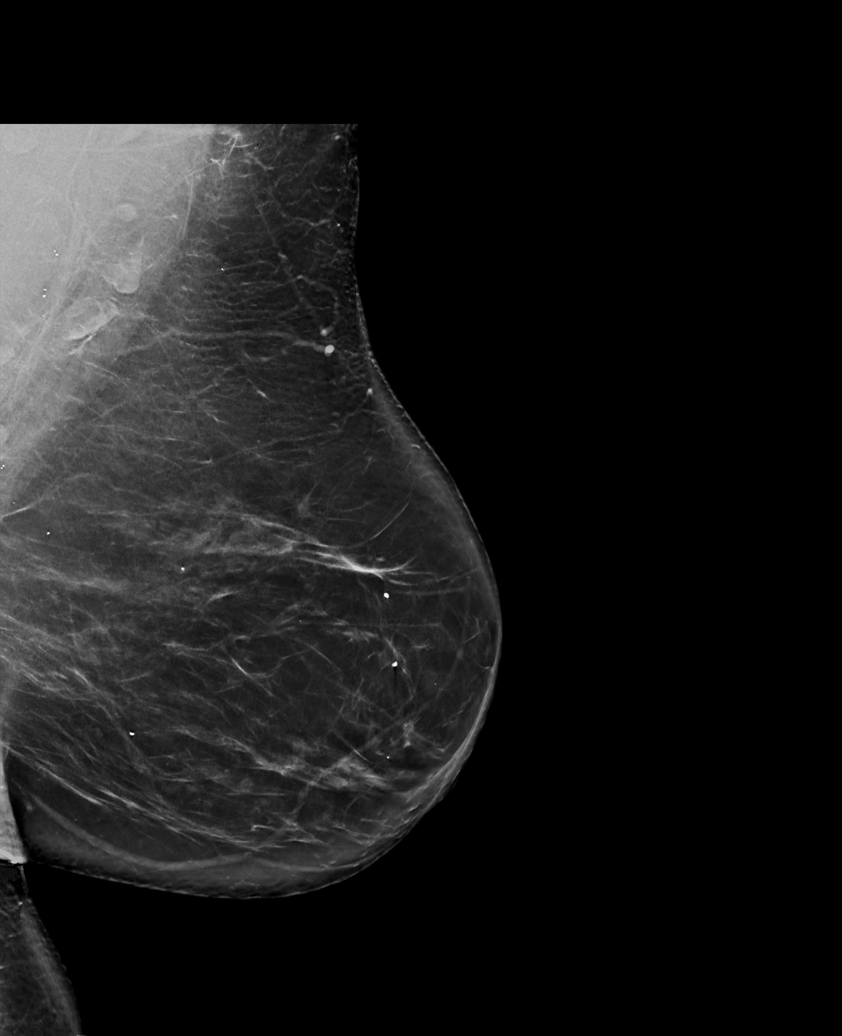

[L MLO synth-2D (2 of 2)]
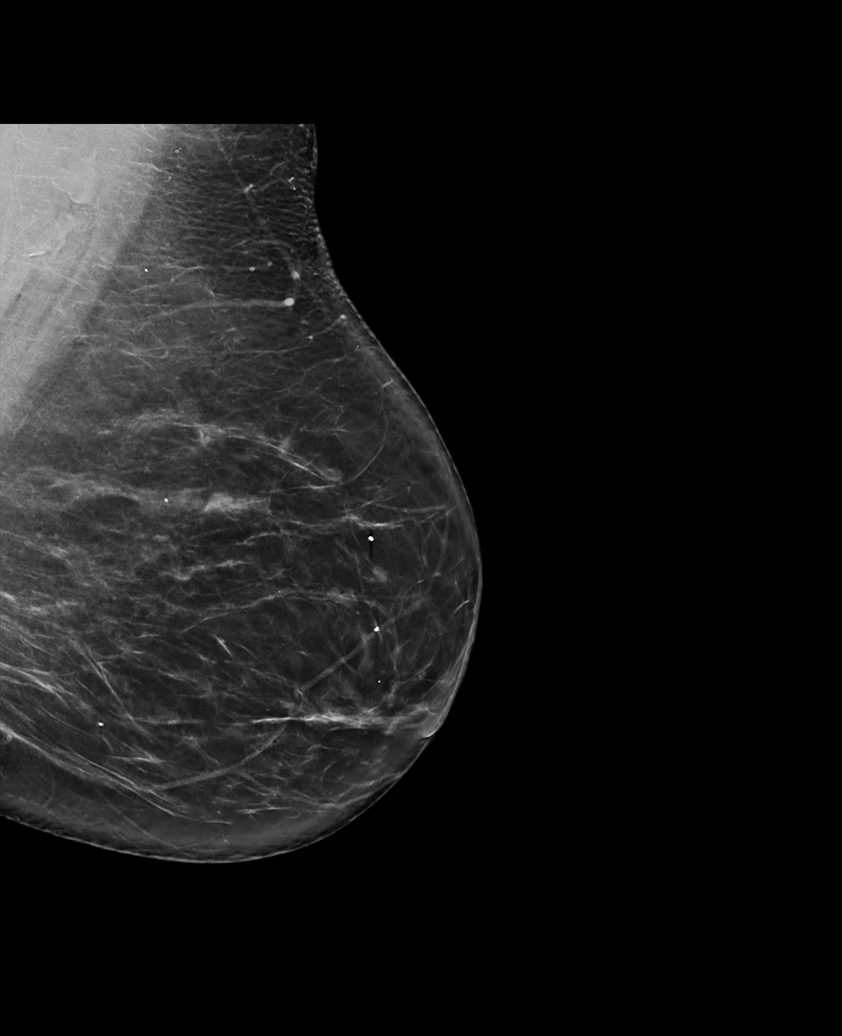

[L MLO tomo · tomo slice 41/80.0]
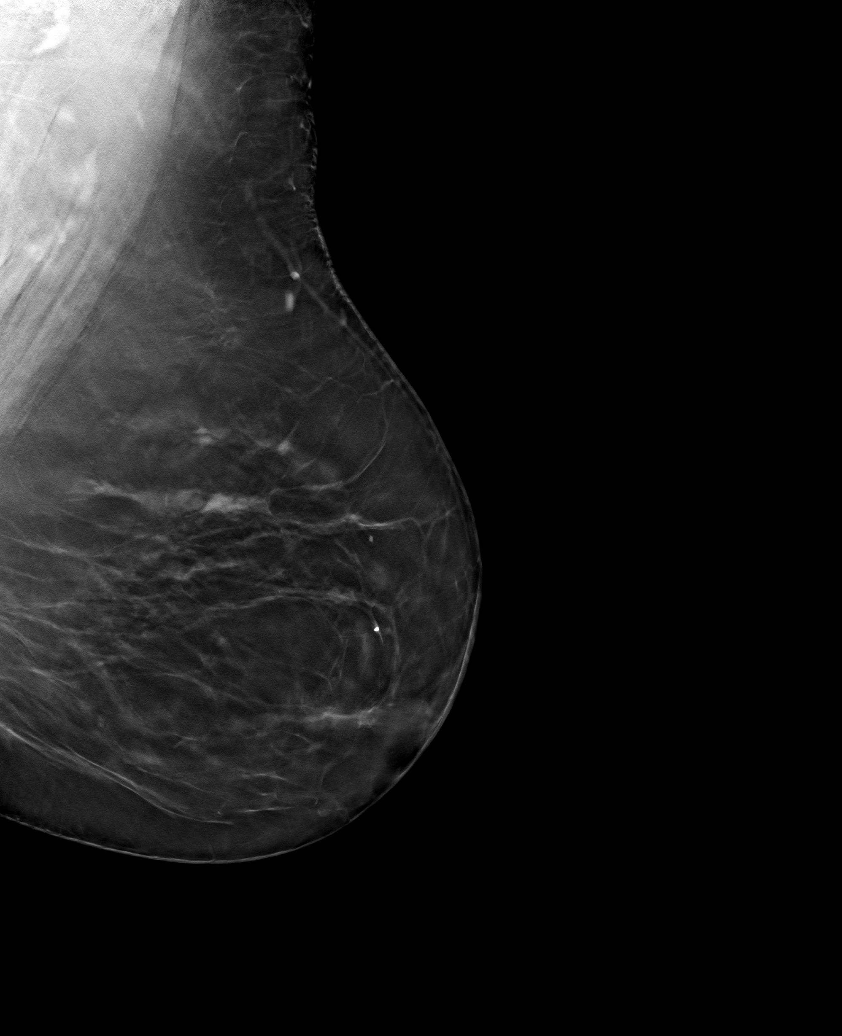

[6 of 30 positions shown; findings below may reference images not displayed]

ACR Breast Density Category b: There are scattered areas of
fibroglandular density.
FINDINGS: There are no findings suspicious for malignancy. Images were
processed with CAD.
IMPRESSION: No mammographic evidence of malignancy. A result letter of this
screening mammogram will be mailed directly to the patient.

RECOMMENDATION:
Screening mammogram in one year. (Code:[id number])

BI-RADS CATEGORY  1: Negative.

## 2022-09-20 DIAGNOSIS — F411 Generalized anxiety disorder: Secondary | ICD-10-CM

## 2022-09-20 MED ORDER — BUPROPION HCL ER (SR) 150 MG PO TB12: 150.0000 mg | ORAL_TABLET | Freq: Every day | ORAL | 0 refills | Status: DC

## 2022-09-27 ENCOUNTER — Other Ambulatory Visit: Payer: Self-pay | Admitting: Primary Care

## 2022-09-27 DIAGNOSIS — I10 Essential (primary) hypertension: Secondary | ICD-10-CM

## 2022-09-27 DIAGNOSIS — F411 Generalized anxiety disorder: Secondary | ICD-10-CM

## 2022-09-27 DIAGNOSIS — G4700 Insomnia, unspecified: Secondary | ICD-10-CM

## 2022-11-04 LAB — HM MAMMOGRAPHY

## 2022-11-27 ENCOUNTER — Other Ambulatory Visit: Payer: Self-pay | Admitting: Primary Care

## 2022-11-27 DIAGNOSIS — F411 Generalized anxiety disorder: Secondary | ICD-10-CM

## 2022-12-19 ENCOUNTER — Other Ambulatory Visit: Payer: Self-pay | Admitting: Primary Care

## 2022-12-19 DIAGNOSIS — F411 Generalized anxiety disorder: Secondary | ICD-10-CM

## 2023-03-30 ENCOUNTER — Other Ambulatory Visit: Payer: Self-pay | Admitting: Primary Care

## 2023-03-30 DIAGNOSIS — I10 Essential (primary) hypertension: Secondary | ICD-10-CM

## 2023-03-31 NOTE — Telephone Encounter (Signed)
Can you find out what's going on with her lisinopril Rx? I sent plenty of refills in March 2024. Let her know.

## 2023-06-17 ENCOUNTER — Encounter: Payer: Self-pay | Admitting: Primary Care

## 2023-06-25 ENCOUNTER — Other Ambulatory Visit: Payer: Self-pay | Admitting: Primary Care

## 2023-06-25 DIAGNOSIS — I10 Essential (primary) hypertension: Secondary | ICD-10-CM

## 2023-06-26 NOTE — Telephone Encounter (Signed)
Patient is due for CPE/follow up in mid March 2025, this will be required prior to any further refills.  Please schedule, thank you!

## 2023-06-26 NOTE — Telephone Encounter (Signed)
Lvm for patient to call and schedule 

## 2023-07-05 ENCOUNTER — Other Ambulatory Visit: Payer: Self-pay | Admitting: Primary Care

## 2023-07-05 DIAGNOSIS — G4700 Insomnia, unspecified: Secondary | ICD-10-CM

## 2023-07-05 DIAGNOSIS — F411 Generalized anxiety disorder: Secondary | ICD-10-CM

## 2023-07-06 NOTE — Telephone Encounter (Signed)
Patient is due for CPE/follow up in mid March, this will be required prior to any further refills.  Please schedule, thank you!

## 2023-07-07 NOTE — Telephone Encounter (Signed)
 Lvmtcb, sent mychart message

## 2023-07-07 NOTE — Telephone Encounter (Signed)
 Pt scheduled cpe, via mychart for 09/12/23

## 2023-08-04 DIAGNOSIS — Z20828 Contact with and (suspected) exposure to other viral communicable diseases: Secondary | ICD-10-CM

## 2023-08-04 MED ORDER — OSELTAMIVIR PHOSPHATE 75 MG PO CAPS: 75.0000 mg | ORAL_CAPSULE | Freq: Every day | ORAL | 0 refills | Status: DC

## 2023-09-12 ENCOUNTER — Encounter: Payer: Managed Care, Other (non HMO) | Admitting: Primary Care

## 2023-09-22 ENCOUNTER — Other Ambulatory Visit: Payer: Self-pay | Admitting: Primary Care

## 2023-09-22 DIAGNOSIS — F411 Generalized anxiety disorder: Secondary | ICD-10-CM

## 2023-09-22 DIAGNOSIS — G4700 Insomnia, unspecified: Secondary | ICD-10-CM

## 2023-09-22 DIAGNOSIS — I10 Essential (primary) hypertension: Secondary | ICD-10-CM

## 2023-09-26 ENCOUNTER — Ambulatory Visit: Payer: Managed Care, Other (non HMO) | Admitting: Primary Care

## 2023-09-26 ENCOUNTER — Encounter: Payer: Self-pay | Admitting: Primary Care

## 2023-09-26 VITALS — BP 122/84 | HR 68 | Temp 97.5°F | Ht 64.25 in | Wt 171.0 lb

## 2023-09-26 DIAGNOSIS — E785 Hyperlipidemia, unspecified: Secondary | ICD-10-CM

## 2023-09-26 DIAGNOSIS — Z0000 Encounter for general adult medical examination without abnormal findings: Secondary | ICD-10-CM | POA: Insufficient documentation

## 2023-09-26 DIAGNOSIS — I10 Essential (primary) hypertension: Secondary | ICD-10-CM | POA: Diagnosis not present

## 2023-09-26 DIAGNOSIS — F411 Generalized anxiety disorder: Secondary | ICD-10-CM

## 2023-09-26 DIAGNOSIS — R7303 Prediabetes: Secondary | ICD-10-CM | POA: Diagnosis not present

## 2023-09-26 DIAGNOSIS — Z1211 Encounter for screening for malignant neoplasm of colon: Secondary | ICD-10-CM

## 2023-09-26 LAB — COMPREHENSIVE METABOLIC PANEL WITH GFR
ALT: 12 U/L (ref 0–35)
AST: 14 U/L (ref 0–37)
Albumin: 4.6 g/dL (ref 3.5–5.2)
Alkaline Phosphatase: 68 U/L (ref 39–117)
BUN: 17 mg/dL (ref 6–23)
CO2: 28 meq/L (ref 19–32)
Calcium: 9.4 mg/dL (ref 8.4–10.5)
Chloride: 100 meq/L (ref 96–112)
Creatinine, Ser: 0.76 mg/dL (ref 0.40–1.20)
GFR: 95.04 mL/min (ref >60.00)
Glucose, Bld: 73 mg/dL (ref 70–99)
Potassium: 4.4 meq/L (ref 3.5–5.1)
Sodium: 137 meq/L (ref 135–145)
Total Bilirubin: 0.7 mg/dL (ref 0.2–1.2)
Total Protein: 7.5 g/dL (ref 6.0–8.3)

## 2023-09-26 LAB — LIPID PANEL
Cholesterol: 242 mg/dL — ABNORMAL HIGH (ref 0–200)
HDL: 51 mg/dL (ref >39.00)
LDL Cholesterol: 170 mg/dL — ABNORMAL HIGH (ref 0–99)
NonHDL: 190.8
Total CHOL/HDL Ratio: 5
Triglycerides: 104 mg/dL (ref 0.0–149.0)
VLDL: 20.8 mg/dL (ref 0.0–40.0)

## 2023-09-26 LAB — HEMOGLOBIN A1C: Hgb A1c MFr Bld: 5.4 % (ref 4.6–6.5)

## 2023-09-26 MED ORDER — FLUOXETINE HCL 20 MG PO CAPS: 20.0000 mg | ORAL_CAPSULE | Freq: Every day | ORAL | 3 refills | Status: AC

## 2023-09-26 MED ORDER — FLUOXETINE HCL 40 MG PO CAPS: 40.0000 mg | ORAL_CAPSULE | Freq: Every day | ORAL | 3 refills | Status: AC

## 2023-09-26 NOTE — Progress Notes (Signed)
 Subjective:    Patient ID: Marie Barnett, female    DOB: Oct 05, 1980, 43 y.o.   MRN: [id number]  HPI  Marie Barnett is a very pleasant 43 y.o. female who presents today for complete physical and follow up of chronic conditions.  Immunizations: -Tetanus: Completed in 2022 -Influenza: Completed last season   Diet: Fair diet.  Exercise: No regular exercise.  Eye exam: Completes annually  Dental exam: Completes semi-annually    Pap Smear: Completed in March 2023 Mammogram: Completed in May 2024  BP Readings from Last 3 Encounters:  09/26/23 122/84  08/30/22 128/82  06/01/21 132/84      Review of Systems  Constitutional:  Negative for unexpected weight change.  HENT:  Negative for rhinorrhea.   Respiratory:  Negative for cough and shortness of breath.   Cardiovascular:  Negative for chest pain.  Gastrointestinal:  Negative for constipation and diarrhea.  Genitourinary:  Negative for difficulty urinating.  Musculoskeletal:  Negative for arthralgias and myalgias.  Skin:  Negative for rash.  Allergic/Immunologic: Negative for environmental allergies.  Neurological:  Negative for dizziness, numbness and headaches.  Psychiatric/Behavioral:  The patient is not nervous/anxious.          Past Medical History:  Diagnosis Date   Fibroids    Uterine   Frequent headaches    Hypertension    Kidney stone     Social History   Socioeconomic History   Marital status: Married    Spouse name: Not on file   Number of children: Not on file   Years of education: Not on file   Highest education level: Bachelor's degree (e.g., BA, AB, BS)  Occupational History   Not on file  Tobacco Use   Smoking status: Never   Smokeless tobacco: Never  Substance and Sexual Activity   Alcohol use: Yes    Alcohol/week: 0.0 standard drinks of alcohol    Comment: social   Drug use: Not on file   Sexual activity: Not on file  Other Topics Concern   Not on file  Social History Narrative    Married.   Works as a Runner, broadcasting/film/video part time.   3 children.   Enjoys spending time at [employer] and pool.    Social Drivers of Health   Financial Resource Strain: Medium Risk (09/22/2023)   Overall Financial Resource Strain (CARDIA)    Difficulty of Paying Living Expenses: Somewhat hard  Food Insecurity: No Food Insecurity (09/22/2023)   Hunger Vital Sign    Worried About Running Out of Food in the Last Year: Never true    Ran Out of Food in the Last Year: Never true  Transportation Needs: No Transportation Needs (09/22/2023)   PRAPARE - Administrator, Civil Service (Medical): No    Lack of Transportation (Non-Medical): No  Physical Activity: Insufficiently Active (09/22/2023)   Exercise Vital Sign    Days of Exercise per Week: 3 days    Minutes of Exercise per Session: 20 min  Stress: Stress Concern Present (09/22/2023)   [employer] of Occupational Health - Occupational Stress Questionnaire    Feeling of Stress : Rather much  Social Connections: Socially Integrated (09/22/2023)   Social Connection and Isolation Panel [NHANES]    Frequency of Communication with Friends and Family: More than three times a week    Frequency of Social Gatherings with Friends and Family: Once a week    Attends Religious Services: More than 4 times per year    Active Member of  Clubs or Organizations: Yes    Attends Engineer, structural: More than 4 times per year    Marital Status: Married  Catering manager Violence: Not on file    Past Surgical History:  Procedure Laterality Date   CESAREAN SECTION  2014    Family History  Problem Relation Age of Onset   Hyperlipidemia Mother    Hypertension Mother    Cystic kidney disease Mother    Hyperlipidemia Father    Hypertension Father    Alcohol abuse Brother    Alcohol abuse Maternal Uncle    Hyperlipidemia Maternal Grandmother    Hypertension Maternal Grandmother    Stroke Maternal Grandmother    Depression Maternal  Grandmother    Prostate cancer Maternal Grandfather    Stroke Maternal Grandfather    Colon cancer Paternal Grandmother    Bipolar disorder Neg Hx     Allergies  Allergen Reactions   Amoxicillin Hives   Amlodipine Swelling    Current Outpatient Medications on File Prior to Visit  Medication Sig Dispense Refill   FLUoxetine (PROZAC) 20 MG capsule TAKE 1 CAPSULE (20 MG TOTAL) BY MOUTH DAILY. FOR ANXIETY. TAKE WITH 40 MG 90 capsule 0   FLUoxetine (PROZAC) 40 MG capsule TAKE 1 CAPSULE (40 MG TOTAL) BY MOUTH DAILY. FOR ANXIETY 90 capsule 0   levonorgestrel (MIRENA) 20 MCG/24HR IUD 1 each by Intrauterine route once.     lisinopril (ZESTRIL) 20 MG tablet TAKE 1 TABLET (20 MG TOTAL) BY MOUTH DAILY. FOR BLOOD PRESSURE 90 tablet 0   Multiple Vitamin (MULTIVITAMIN) capsule Take 1 capsule by mouth daily.     VITAMIN D, ERGOCALCIFEROL, PO Take by mouth. Taking 2 gummy daily.     buPROPion (WELLBUTRIN SR) 150 MG 12 hr tablet TAKE 1 TABLET (150 MG TOTAL) BY MOUTH DAILY. FOR ANXIETY (Patient not taking: Reported on 09/26/2023) 90 tablet 1   oseltamivir (TAMIFLU) 75 MG capsule Take 1 capsule (75 mg total) by mouth daily. (Patient not taking: Reported on 09/26/2023) 7 capsule 0   No current facility-administered medications on file prior to visit.    BP 122/84   Pulse 68   Temp (!) 97.5 F (36.4 C) (Temporal)   Ht 5' 4.25" (1.632 m)   Wt 171 lb (77.6 kg)   LMP 09/08/2023   SpO2 98%   BMI 29.12 kg/m  Objective:   Physical Exam HENT:     Right Ear: Tympanic membrane and ear canal normal.     Left Ear: Tympanic membrane and ear canal normal.  Eyes:     Pupils: Pupils are equal, round, and reactive to light.  Cardiovascular:     Rate and Rhythm: Normal rate and regular rhythm.  Pulmonary:     Effort: Pulmonary effort is normal.     Breath sounds: Normal breath sounds.  Abdominal:     General: Bowel sounds are normal.     Palpations: Abdomen is soft.     Tenderness: There is no abdominal  tenderness.  Musculoskeletal:        General: Normal range of motion.     Cervical back: Neck supple.  Skin:    General: Skin is warm and dry.  Neurological:     Mental Status: She is alert and oriented to person, place, and time.     Cranial Nerves: No cranial nerve deficit.     Deep Tendon Reflexes:     Reflex Scores:      Patellar reflexes are 2+ on the right  side and 2+ on the left side. Psychiatric:        Mood and Affect: Mood normal.           Assessment & Plan:  Preventative health care Assessment & Plan: Immunizations UTD. Pap smear UTD. Mammogram due 10/2023 - scheduled per Gyn Colonoscopy due 2025 - referral to GI ordered    Discussed the importance of a healthy diet and regular exercise in order for weight loss, and to reduce the risk of further co-morbidity.  Commended on weight loss, dietary and lifestyle modifications.   Exam stable. Labs pending - CMET, CBC, lipid panel, Hgb A1c  Follow up in 1 year for repeat physical.  I evaluated patient, was consulted regarding treatment, and agree with assessment and plan per Julaine Fusi, MSN, FNP student.   Marie Reel, NP-C    Screening for colon cancer -     Ambulatory referral to Gastroenterology  Hyperlipidemia, unspecified hyperlipidemia type Assessment & Plan: Uncontrolled per 08/2022 labs with LDL 171  Elevated LPa at 172  Lipid panel, CMET ordered - results pending  Commended on dietary and lifestyle modifications   Will follow up with recommendations based on labs  I evaluated patient, was consulted regarding treatment, and agree with assessment and plan per Julaine Fusi, MSN, FNP student.   Marie Reel, NP-C   Orders: -     Lipid panel  Prediabetes Assessment & Plan: Improved per 08/2022 labs with A1c 5.6%  Hgb A1c ordered - results pending  Commended on dietary and lifestyle modifications  I evaluated patient, was consulted regarding treatment, and agree with assessment and plan  per Julaine Fusi, MSN, FNP student.   Marie Reel, NP-C   Orders: -     Lipid panel -     Hemoglobin A1c  Essential hypertension Assessment & Plan: BP at goal Continue lisinopril 20mg  daily   CMET ordered - results pending  I evaluated patient, was consulted regarding treatment, and agree with assessment and plan per Julaine Fusi, MSN, FNP student.   Marie Reel, NP-C'  Orders: -     Comprehensive metabolic panel with GFR  GAD (generalized anxiety disorder) Assessment & Plan: Controlled, stable Continue fluoxetine 20mg  + 40mg  daily   I evaluated patient, was consulted regarding treatment, and agree with assessment and plan per Julaine Fusi, MSN, FNP student.   Marie Reel, NP-C          Doreene Nest, NP

## 2023-09-26 NOTE — Patient Instructions (Signed)
 Medications:  No changes today   Labs:  Lipid panel, CMET, A1c, CBC today   Referrals:  [facility] -- discuss screening colonoscopy   Follow up:  1 year for annual physical

## 2023-09-26 NOTE — Assessment & Plan Note (Deleted)
 Immunizations UTD. Pap smear UTD. Mammogram due 10/2023 - scheduled per Gyn Colonoscopy due 2025 - referral to GI ordered    Discussed the importance of a healthy diet and regular exercise in order for weight loss, and to reduce the risk of further co-morbidity.  Commended on weight loss, dietary and lifestyle modifications.   Exam stable. Labs pending - CMET, CBC, lipid panel, Hgb A1c  Follow up in 1 year for repeat physical.

## 2023-09-26 NOTE — Assessment & Plan Note (Addendum)
 Immunizations UTD. Pap smear UTD. Mammogram due 10/2023 - scheduled per Gyn Colonoscopy due 2025 - referral to GI ordered    Discussed the importance of a healthy diet and regular exercise in order for weight loss, and to reduce the risk of further co-morbidity.  Commended on weight loss, dietary and lifestyle modifications.   Exam stable. Labs pending - CMET, CBC, lipid panel, Hgb A1c  Follow up in 1 year for repeat physical.  I evaluated patient, was consulted regarding treatment, and agree with assessment and plan per Julaine Fusi, MSN, FNP student.   Mayra Reel, NP-C

## 2023-09-26 NOTE — Assessment & Plan Note (Addendum)
 BP at goal Continue lisinopril 20mg  daily   CMET ordered - results pending  I evaluated patient, was consulted regarding treatment, and agree with assessment and plan per Julaine Fusi, MSN, FNP student.   Mayra Reel, NP-C'

## 2023-09-26 NOTE — Assessment & Plan Note (Addendum)
 Improved per 08/2022 labs with A1c 5.6%  Hgb A1c ordered - results pending  Commended on dietary and lifestyle modifications  I evaluated patient, was consulted regarding treatment, and agree with assessment and plan per Julaine Fusi, MSN, FNP student.   Mayra Reel, NP-C

## 2023-09-26 NOTE — Assessment & Plan Note (Addendum)
 Uncontrolled per 08/2022 labs with LDL 171  Elevated LPa at 172  Lipid panel, CMET ordered - results pending  Commended on dietary and lifestyle modifications   Will follow up with recommendations based on labs  I evaluated patient, was consulted regarding treatment, and agree with assessment and plan per Julaine Fusi, MSN, FNP student.   Mayra Reel, NP-C

## 2023-09-26 NOTE — Progress Notes (Signed)
 Established Patient Office Visit  Subjective   Patient ID: Marie Barnett, female    DOB: 12/18/1980  Age: 43 y.o. MRN: [id number]  Chief Complaint  Patient presents with   Annual Exam    Fasting     HPI Anali is a 43 year old female with history of hypertension, hyperlipidemia, elevated LPa, prediabetes, psoriasis, insomnia, GAD who presents today for annual physical exam.   Layken has been doing well since last visit. Reports having lost approximately 40lbs through [facility]. She has made significant dietary and lifestyle modifications. She is hopeful these efforts have made positive impacts on her prediabetes and cholesterol.   Anxiety is well controlled on fluoxetine. She is no longer taking wellbutrin.    She has no acute concerns today.   Immunizations: -Tetanus: Completed 05/2021 -Influenza: 03/2023  Diet:  Beverages: at least 64oz water,/daily, unsweet tea AM fasting 4oz lean protein, 4oz vegetables, 4oz fruit for lunch and dinner Snacks: protein, fruit  Exercise: walking  Eye exam: Completes annually  Dental exam: Completed >1 year ago  Pap Smear: Completed in 10/2021 Mammogram: Completed 10/2022  Colonoscopy: due to begin screening on/after June 2025   Review of Systems  Eyes: Negative.   Respiratory:  Negative for shortness of breath.   Cardiovascular:  Negative for chest pain, palpitations and leg swelling.  Gastrointestinal:  Negative for constipation, diarrhea, nausea and vomiting.  Genitourinary:  Negative for frequency and urgency.  Musculoskeletal: Negative.   Neurological:  Positive for headaches. Negative for dizziness and tingling.       Relieved with PRN tylenol and/or ibuprofen  Psychiatric/Behavioral:  Negative for depression. The patient is not nervous/anxious.       Objective:     BP 122/84   Pulse 68   Temp (!) 97.5 F (36.4 C) (Temporal)   Ht 5' 4.25" (1.632 m)   Wt 77.6 kg   LMP 09/08/2023   SpO2 98%   BMI 29.12  kg/m    BP Readings from Last 3 Encounters:  09/26/23 122/84  08/30/22 128/82  06/01/21 132/84   Wt Readings from Last 3 Encounters:  09/26/23 77.6 kg  08/30/22 91.6 kg  08/17/21 98 kg    Physical Exam Constitutional:      Appearance: Normal appearance.  HENT:     Right Ear: Tympanic membrane, ear canal and external ear normal.     Left Ear: Tympanic membrane, ear canal and external ear normal.  Eyes:     Extraocular Movements: Extraocular movements intact.     Conjunctiva/sclera: Conjunctivae normal.     Pupils: Pupils are equal, round, and reactive to light.  Cardiovascular:     Rate and Rhythm: Normal rate and regular rhythm.     Pulses:          Radial pulses are 2+ on the right side and 2+ on the left side.       Posterior tibial pulses are 2+ on the right side and 2+ on the left side.     Heart sounds: Normal heart sounds.  Pulmonary:     Effort: Pulmonary effort is normal.     Breath sounds: Normal breath sounds.  Abdominal:     General: Bowel sounds are normal.     Palpations: Abdomen is soft.  Musculoskeletal:     Cervical back: Normal range of motion and neck supple.     Right lower leg: No edema.     Left lower leg: No edema.  Neurological:     Mental  Status: She is alert.  Psychiatric:        Attention and Perception: Attention normal.        Mood and Affect: Mood and affect normal.        Behavior: Behavior is cooperative.     No results found for any visits on 09/26/23.    The 10-year ASCVD risk score (Arnett DK, et al., 2019) is: 2%    Assessment & Plan:   Problem List Items Addressed This Visit       Cardiovascular and Mediastinum   Essential hypertension   BP at goal Continue lisinopril 20mg  daily   CMET ordered - results pending      Relevant Orders   Comprehensive metabolic panel with GFR     Other   Hyperlipidemia   Uncontrolled per 08/2022 labs with LDL 171  Elevated LPa at 172  Lipid panel, CMET ordered - results  pending  Commended on dietary and lifestyle modifications   Will follow up with recommendations based on labs      Relevant Orders   Lipid Panel   GAD (generalized anxiety disorder)   Controlled, stable Continue fluoxetine 20mg  + 40mg  daily        Prediabetes   Improved per 08/2022 labs with A1c 5.6%  Hgb A1c ordered - results pending  Commended on dietary and lifestyle modifications      Relevant Orders   Lipid Panel   Hemoglobin A1c   Preventative health care - Primary   Immunizations UTD. Pap smear UTD. Mammogram due 10/2023 - scheduled per Gyn Colonoscopy due 2025 - referral to GI ordered    Discussed the importance of a healthy diet and regular exercise in order for weight loss, and to reduce the risk of further co-morbidity.  Commended on weight loss, dietary and lifestyle modifications.   Exam stable. Labs pending - CMET, CBC, lipid panel, Hgb A1c  Follow up in 1 year for repeat physical.       Relevant Orders   Lipid Panel   Hemoglobin A1c   Comprehensive metabolic panel with GFR   Other Visit Diagnoses       Screening for colon cancer       Relevant Orders   Ambulatory referral to Gastroenterology       Follow up in 1 year for annual physical exam, or sooner as needed    Lindell Spar, RN

## 2023-09-26 NOTE — Assessment & Plan Note (Addendum)
 Controlled, stable Continue fluoxetine 20mg  + 40mg  daily   I evaluated patient, was consulted regarding treatment, and agree with assessment and plan per Julaine Fusi, MSN, FNP student.   Mayra Reel, NP-C

## 2023-09-29 MED ORDER — ROSUVASTATIN CALCIUM 10 MG PO TABS: 10.0000 mg | ORAL_TABLET | Freq: Every day | ORAL | 0 refills | Status: DC

## 2023-10-09 ENCOUNTER — Encounter: Payer: Self-pay | Admitting: Internal Medicine

## 2023-10-13 DIAGNOSIS — F411 Generalized anxiety disorder: Secondary | ICD-10-CM

## 2023-10-14 MED ORDER — PROPRANOLOL HCL 10 MG PO TABS: ORAL_TABLET | ORAL | 0 refills | Status: DC

## 2023-10-27 ENCOUNTER — Other Ambulatory Visit: Payer: Self-pay | Admitting: Primary Care

## 2023-10-27 DIAGNOSIS — F411 Generalized anxiety disorder: Secondary | ICD-10-CM

## 2023-12-12 ENCOUNTER — Ambulatory Visit (AMBULATORY_SURGERY_CENTER)

## 2023-12-12 VITALS — Ht 64.25 in | Wt 176.0 lb

## 2023-12-12 DIAGNOSIS — Z1211 Encounter for screening for malignant neoplasm of colon: Secondary | ICD-10-CM

## 2023-12-12 MED ORDER — NA SULFATE-K SULFATE-MG SULF 17.5-3.13-1.6 GM/177ML PO SOLN: 1.0000 | Freq: Once | ORAL | 0 refills | Status: AC

## 2023-12-12 NOTE — Progress Notes (Signed)

## 2023-12-19 ENCOUNTER — Encounter: Payer: Self-pay | Admitting: Internal Medicine

## 2023-12-26 ENCOUNTER — Other Ambulatory Visit: Payer: Self-pay | Admitting: Primary Care

## 2023-12-26 DIAGNOSIS — E785 Hyperlipidemia, unspecified: Secondary | ICD-10-CM

## 2023-12-26 NOTE — Telephone Encounter (Signed)
 Please call patient:  She needs repeat labs for cholesterol check since she's been taking the new rosuvastatin  cholesterol pill.  Lab only appt is fine. 4 hour fast.

## 2023-12-26 NOTE — Telephone Encounter (Signed)
 Lvm to schedule fasting lab only appt.

## 2024-01-09 ENCOUNTER — Ambulatory Visit (AMBULATORY_SURGERY_CENTER): Admitting: Internal Medicine

## 2024-01-09 ENCOUNTER — Encounter: Payer: Self-pay | Admitting: Internal Medicine

## 2024-01-09 VITALS — BP 106/74 | HR 64 | Temp 97.9°F | Resp 11 | Ht 64.0 in | Wt 176.0 lb

## 2024-01-09 DIAGNOSIS — D122 Benign neoplasm of ascending colon: Secondary | ICD-10-CM | POA: Diagnosis not present

## 2024-01-09 DIAGNOSIS — D124 Benign neoplasm of descending colon: Secondary | ICD-10-CM

## 2024-01-09 DIAGNOSIS — K648 Other hemorrhoids: Secondary | ICD-10-CM

## 2024-01-09 DIAGNOSIS — K635 Polyp of colon: Secondary | ICD-10-CM | POA: Diagnosis not present

## 2024-01-09 DIAGNOSIS — Z1211 Encounter for screening for malignant neoplasm of colon: Secondary | ICD-10-CM

## 2024-01-09 MED ORDER — SODIUM CHLORIDE 0.9 % IV SOLN: 500.0000 mL | Freq: Once | INTRAVENOUS | Status: DC

## 2024-01-09 NOTE — Patient Instructions (Signed)
-   4 polyps removed and sent to pathology, hemorrhoids. - Await pathology results. - The findings and recommendations were discussed      with the patient.  YOU HAD AN ENDOSCOPIC PROCEDURE TODAY AT THE [facility]:   Refer to the procedure report that was given to you for any specific questions about what was found during the examination.  If the procedure report does not answer your questions, please call your gastroenterologist to clarify.  If you requested that your care partner not be given the details of your procedure findings, then the procedure report has been included in a sealed envelope for you to review at your convenience later.  YOU SHOULD EXPECT: Some feelings of bloating in the abdomen. Passage of more gas than usual.  Walking can help get rid of the air that was put into your GI tract during the procedure and reduce the bloating. If you had a lower endoscopy (such as a colonoscopy or flexible sigmoidoscopy) you may notice spotting of blood in your stool or on the toilet paper. If you underwent a bowel prep for your procedure, you may not have a normal bowel movement for a few days.  Please Note:  You might notice some irritation and congestion in your nose or some drainage.  This is from the oxygen used during your procedure.  There is no need for concern and it should clear up in a day or so.  SYMPTOMS TO REPORT IMMEDIATELY:  Following lower endoscopy (colonoscopy or flexible sigmoidoscopy):  Excessive amounts of blood in the stool  Significant tenderness or worsening of abdominal pains  Swelling of the abdomen that is new, acute  Fever of 100F or higher   For urgent or emergent issues, a gastroenterologist can be reached at any hour by calling [phone]. Do not use MyChart messaging for urgent concerns.    DIET:  We do recommend a small meal at first, but then you may proceed to your regular diet.  Drink plenty of fluids but you should avoid alcoholic  beverages for 24 hours.  ACTIVITY:  You should plan to take it easy for the rest of today and you should NOT DRIVE or use heavy machinery until tomorrow (because of the sedation medicines used during the test).    FOLLOW UP: Our staff will call the number listed on your records the next business day following your procedure.  We will call around 7:15- 8:00 am to check on you and address any questions or concerns that you may have regarding the information given to you following your procedure. If we do not reach you, we will leave a message.     If any biopsies were taken you will be contacted by phone or by letter within the next 1-3 weeks.  Please call us  at [phone] if you have not heard about the biopsies in 3 weeks.    SIGNATURES/CONFIDENTIALITY: You and/or your care partner have signed paperwork which will be entered into your electronic medical record.  These signatures attest to the fact that that the information above on your After Visit Summary has been reviewed and is understood.  Full responsibility of the confidentiality of this discharge information lies with you and/or your care-partner.

## 2024-01-09 NOTE — Op Note (Signed)
  [facility] Patient Name: Marie Barnett Procedure Date: 01/09/2024 10:40 AM MRN: [id number] Endoscopist: Rosario Estefana Kidney , , [id number] Age: 43 Referring MD:  Date of Birth: 1981-02-21 Gender: Female Account #: [id number] Procedure:                Colonoscopy Indications:              Screening for colorectal malignant neoplasm, This                            is the patient's first colonoscopy Medicines:                Monitored Anesthesia Care Procedure:                Pre-Anesthesia Assessment:                           - Prior to the procedure, a History and Physical                            was performed, and patient medications and                            allergies were reviewed. The patient's tolerance of                            previous anesthesia was also reviewed. The risks                            and benefits of the procedure and the sedation                            options and risks were discussed with the patient.                            All questions were answered, and informed consent                            was obtained. Prior Anticoagulants: The patient has                            taken no anticoagulant or antiplatelet agents. ASA                            Grade Assessment: II - A patient with mild systemic                            disease. After reviewing the risks and benefits,                            the patient was deemed in satisfactory condition to                            undergo the procedure.  After obtaining informed consent, the colonoscope                            was passed under direct vision. Throughout the                            procedure, the patient's blood pressure, pulse, and                            oxygen saturations were monitored continuously. The                            CF [id number] #[id number] was introduced through the anus                            and advanced to  the the terminal ileum. The                            colonoscopy was performed without difficulty. The                            patient tolerated the procedure well. The quality                            of the bowel preparation was excellent. The                            terminal ileum, ileocecal valve, appendiceal                            orifice, and rectum were photographed. Scope In: 10:51:32 AM Scope Out: 11:08:31 AM Scope Withdrawal Time: 0 hours 14 minutes 23 seconds  Total Procedure Duration: 0 hours 16 minutes 59 seconds  Findings:                 The terminal ileum appeared normal.                           An 11 mm polyp was found in the ascending colon.                            The polyp was pedunculated. The polyp was removed                            with a hot snare. Resection and retrieval were                            complete.                           Three sessile polyps were found in the descending                            colon. The polyps were 3 to 6 mm in size. These  polyps were removed with a cold snare. Resection                            and retrieval were complete.                           Non-bleeding internal hemorrhoids were found during                            retroflexion. Complications:            No immediate complications. Estimated Blood Loss:     Estimated blood loss was minimal. Impression:               - The examined portion of the ileum was normal.                           - One 11 mm polyp in the ascending colon, removed                            with a hot snare. Resected and retrieved.                           - Three 3 to 6 mm polyps in the descending colon,                            removed with a cold snare. Resected and retrieved.                           - Non-bleeding internal hemorrhoids. Recommendation:           - Discharge patient to home (with escort).                            - Await pathology results.                           - The findings and recommendations were discussed                            with the patient. Dr Estefana Federico Rosario Estefana Federico,  01/09/2024 11:11:25 AM

## 2024-01-09 NOTE — Progress Notes (Signed)
 Report given to PACU, vss

## 2024-01-09 NOTE — Progress Notes (Signed)
 GASTROENTEROLOGY PROCEDURE H&P NOTE   Primary Care Physician: Gretta Comer POUR, NP    Reason for Procedure:   Colon cancer screening  Plan:    Colonoscopy  Patient is appropriate for endoscopic procedure(s) in the ambulatory ([facility]) setting.  The nature of the procedure, as well as the risks, benefits, and alternatives were carefully and thoroughly reviewed with the patient. Ample time for discussion and questions allowed. The patient understood, was satisfied, and agreed to proceed.     HPI: Marie Barnett is a 43 y.o. female who presents for colonoscopy for colon cancer screening. Denies blood in stools, changes in bowel habits, or unintentional weight loss. Grandmother had colon cancer.  Past Medical History:  Diagnosis Date   Fibroids    Uterine   Frequent headaches    Hypertension    Kidney stone     Past Surgical History:  Procedure Laterality Date   CESAREAN SECTION  2014    Prior to Admission medications   Medication Sig Start Date End Date Taking? Authorizing Provider  FLUoxetine  (PROZAC ) 20 MG capsule Take 1 capsule (20 mg total) by mouth daily. for anxiety. Take with 40 mg 09/26/23   Clark, Katherine K, NP  FLUoxetine  (PROZAC ) 40 MG capsule Take 1 capsule (40 mg total) by mouth daily. For anxiety 09/26/23   Clark, Katherine K, NP  levonorgestrel  (MIRENA ) 20 MCG/24HR IUD 1 each by Intrauterine route once.    [provider]  lisinopril  (ZESTRIL ) 20 MG tablet TAKE 1 TABLET BY MOUTH DAILY FOR BLOOD PRESSURE 09/26/23   Clark, Katherine K, NP  Multiple Vitamin (MULTIVITAMIN) capsule Take 1 capsule by mouth daily.    [provider]  propranolol  (INDERAL ) 10 MG tablet TAKE 1-2 CAPSULES BY MOUTH AS NEEDED FOR ANXIETY. 10/27/23   Clark, Katherine K, NP  rosuvastatin  (CRESTOR ) 10 MG tablet TAKE 1 TABLET BY MOUTH DAILY. FOR CHOLESTEROL 12/26/23   Clark, Katherine K, NP  VITAMIN D, ERGOCALCIFEROL, PO Take by mouth. Taking 2 gummy daily.    [provider]    Current Outpatient Medications  Medication Sig Dispense Refill   FLUoxetine  (PROZAC ) 20 MG capsule Take 1 capsule (20 mg total) by mouth daily. for anxiety. Take with 40 mg 90 capsule 3   FLUoxetine  (PROZAC ) 40 MG capsule Take 1 capsule (40 mg total) by mouth daily. For anxiety 90 capsule 3   levonorgestrel  (MIRENA ) 20 MCG/24HR IUD 1 each by Intrauterine route once.     lisinopril  (ZESTRIL ) 20 MG tablet TAKE 1 TABLET BY MOUTH DAILY FOR BLOOD PRESSURE 90 tablet 3   Multiple Vitamin (MULTIVITAMIN) capsule Take 1 capsule by mouth daily.     rosuvastatin  (CRESTOR ) 10 MG tablet TAKE 1 TABLET BY MOUTH DAILY. FOR CHOLESTEROL 90 tablet 2   VITAMIN D, ERGOCALCIFEROL, PO Take by mouth. Taking 2 gummy daily.     propranolol  (INDERAL ) 10 MG tablet TAKE 1-2 CAPSULES BY MOUTH AS NEEDED FOR ANXIETY. 180 tablet 0   Current Facility-Administered Medications  Medication Dose Route Frequency Provider Last Rate Last Admin   0.9 %  sodium chloride  infusion  500 mL Intravenous Once Sibley Rolison C, MD        Allergies as of 01/09/2024 - Review Complete 01/09/2024  Allergen Reaction Noted   Amoxicillin Hives 03/16/2015   Amlodipine  Swelling 03/24/2018    Family History  Problem Relation Age of Onset   Hyperlipidemia Mother    Hypertension Mother    Cystic kidney disease Mother    Hyperlipidemia Father  Hypertension Father    Alcohol abuse Brother    Alcohol abuse Maternal Uncle    Hyperlipidemia Maternal Grandmother    Hypertension Maternal Grandmother    Stroke Maternal Grandmother    Depression Maternal Grandmother    Prostate cancer Maternal Grandfather    Stroke Maternal Grandfather    Colon cancer Paternal Grandmother    Bipolar disorder Neg Hx    Rectal cancer Neg Hx    Stomach cancer Neg Hx    Esophageal cancer Neg Hx     Social History   Socioeconomic History   Marital status: Married    Spouse name: Not on file   Number of children: Not on file   Years of  education: Not on file   Highest education level: Bachelor's degree (e.g., BA, AB, BS)  Occupational History   Not on file  Tobacco Use   Smoking status: Never   Smokeless tobacco: Never  Vaping Use   Vaping status: Never Used  Substance and Sexual Activity   Alcohol use: Yes    Alcohol/week: 0.0 standard drinks of alcohol    Comment: social   Drug use: Never   Sexual activity: Not on file  Other Topics Concern   Not on file  Social History Narrative   Married.   Works as a Runner, broadcasting/film/video part time.   3 children.   Enjoys spending time at [employer] and pool.    Social Drivers of Health   Financial Resource Strain: Medium Risk (09/22/2023)   Overall Financial Resource Strain (CARDIA)    Difficulty of Paying Living Expenses: Somewhat hard  Food Insecurity: No Food Insecurity (09/22/2023)   Hunger Vital Sign    Worried About Running Out of Food in the Last Year: Never true    Ran Out of Food in the Last Year: Never true  Transportation Needs: No Transportation Needs (09/22/2023)   PRAPARE - Administrator, Civil Service (Medical): No    Lack of Transportation (Non-Medical): No  Physical Activity: Insufficiently Active (09/22/2023)   Exercise Vital Sign    Days of Exercise per Week: 3 days    Minutes of Exercise per Session: 20 min  Stress: Stress Concern Present (09/22/2023)   [employer] of Occupational Health - Occupational Stress Questionnaire    Feeling of Stress : Rather much  Social Connections: Socially Integrated (09/22/2023)   Social Connection and Isolation Panel    Frequency of Communication with Friends and Family: More than three times a week    Frequency of Social Gatherings with Friends and Family: Once a week    Attends Religious Services: More than 4 times per year    Active Member of [employer] or Organizations: Yes    Attends Engineer, structural: More than 4 times per year    Marital Status: Married  Catering manager Violence: Not on file     Physical Exam: Vital signs in last 24 hours: BP 125/80   Pulse 75   Temp 97.9 F (36.6 C)   Ht 5' 4 (1.626 m)   Wt 176 lb (79.8 kg)   LMP 12/27/2023 (Exact Date)   SpO2 97%   BMI 30.21 kg/m  GEN: NAD EYE: Sclerae anicteric ENT: MMM CV: Non-tachycardic Pulm: No increased work of breathing GI: Soft, NT/ND NEURO:  Alert & Oriented   Estefana Kidney, MD [facility]  01/09/2024 10:18 AM

## 2024-01-09 NOTE — Progress Notes (Signed)
 Pt's states no medical or surgical changes since previsit or office visit.

## 2024-01-12 ENCOUNTER — Telehealth: Payer: Self-pay

## 2024-01-12 NOTE — Telephone Encounter (Signed)
Attempted to reach patient for post-procedure f/u call. No answer. Left message for her to please not hesitate to call if she has any questions/concerns regarding her care. 

## 2024-01-13 ENCOUNTER — Other Ambulatory Visit (INDEPENDENT_AMBULATORY_CARE_PROVIDER_SITE_OTHER)

## 2024-01-13 ENCOUNTER — Ambulatory Visit: Payer: Self-pay | Admitting: Primary Care

## 2024-01-13 DIAGNOSIS — E785 Hyperlipidemia, unspecified: Secondary | ICD-10-CM | POA: Diagnosis not present

## 2024-01-13 LAB — HEPATIC FUNCTION PANEL
ALT: 11 U/L (ref 0–35)
AST: 11 U/L (ref 0–37)
Albumin: 4.4 g/dL (ref 3.5–5.2)
Alkaline Phosphatase: 66 U/L (ref 39–117)
Bilirubin, Direct: 0.1 mg/dL (ref 0.0–0.3)
Total Bilirubin: 0.7 mg/dL (ref 0.2–1.2)
Total Protein: 7 g/dL (ref 6.0–8.3)

## 2024-01-13 LAB — SURGICAL PATHOLOGY

## 2024-01-13 LAB — LIPID PANEL
Cholesterol: 143 mg/dL (ref 0–200)
HDL: 48 mg/dL (ref >39.00)
LDL Cholesterol: 73 mg/dL (ref 0–99)
NonHDL: 94.6
Total CHOL/HDL Ratio: 3
Triglycerides: 106 mg/dL (ref 0.0–149.0)
VLDL: 21.2 mg/dL (ref 0.0–40.0)

## 2024-01-14 ENCOUNTER — Ambulatory Visit: Payer: Self-pay | Admitting: Internal Medicine

## 2024-02-27 ENCOUNTER — Other Ambulatory Visit: Payer: Self-pay | Admitting: Primary Care

## 2024-02-27 DIAGNOSIS — F411 Generalized anxiety disorder: Secondary | ICD-10-CM

## 2024-09-28 ENCOUNTER — Encounter: Admitting: Primary Care
